# Patient Record
Sex: Female | Born: 1945
Health system: Southern US, Community
[De-identification: ages and names within clinical notes are randomized; demographics above are authoritative.]

## PROBLEM LIST (undated history)

## (undated) DIAGNOSIS — I10 Essential (primary) hypertension: Secondary | ICD-10-CM

## (undated) DIAGNOSIS — K219 Gastro-esophageal reflux disease without esophagitis: Secondary | ICD-10-CM

## (undated) HISTORY — DX: Essential (primary) hypertension: I10

## (undated) HISTORY — PX: TONSILLECTOMY: SUR1361

## (undated) HISTORY — PX: TUBAL LIGATION: SHX77

## (undated) HISTORY — DX: Gastro-esophageal reflux disease without esophagitis: K21.9

## (undated) HISTORY — PX: SKIN BIOPSY: SHX1

---

## 2001-11-14 ENCOUNTER — Encounter: Payer: Self-pay | Admitting: Emergency Medicine

## 2001-11-14 ENCOUNTER — Emergency Department (HOSPITAL_COMMUNITY): Admission: EM | Admit: 2001-11-14 | Discharge: 2001-11-14 | Payer: Self-pay | Admitting: Emergency Medicine

## 2002-01-08 ENCOUNTER — Ambulatory Visit (HOSPITAL_COMMUNITY): Admission: RE | Admit: 2002-01-08 | Discharge: 2002-01-08 | Payer: Self-pay | Admitting: Internal Medicine

## 2002-01-08 ENCOUNTER — Encounter: Payer: Self-pay | Admitting: Internal Medicine

## 2002-01-15 ENCOUNTER — Encounter: Payer: Self-pay | Admitting: Internal Medicine

## 2002-01-15 ENCOUNTER — Ambulatory Visit (HOSPITAL_COMMUNITY): Admission: RE | Admit: 2002-01-15 | Discharge: 2002-01-15 | Payer: Self-pay | Admitting: Internal Medicine

## 2002-06-26 ENCOUNTER — Ambulatory Visit (HOSPITAL_COMMUNITY): Admission: RE | Admit: 2002-06-26 | Discharge: 2002-06-26 | Payer: Self-pay | Admitting: Internal Medicine

## 2002-06-26 ENCOUNTER — Encounter: Payer: Self-pay | Admitting: Internal Medicine

## 2007-04-15 ENCOUNTER — Emergency Department (HOSPITAL_COMMUNITY): Admission: EM | Admit: 2007-04-15 | Discharge: 2007-04-15 | Payer: Self-pay | Admitting: Emergency Medicine

## 2010-01-09 ENCOUNTER — Ambulatory Visit (HOSPITAL_COMMUNITY): Admission: RE | Admit: 2010-01-09 | Discharge: 2010-01-09 | Payer: Self-pay | Admitting: Internal Medicine

## 2010-02-10 ENCOUNTER — Encounter: Payer: Self-pay | Admitting: Gastroenterology

## 2010-02-27 ENCOUNTER — Ambulatory Visit (HOSPITAL_COMMUNITY): Admission: RE | Admit: 2010-02-27 | Discharge: 2010-02-27 | Payer: Self-pay | Admitting: Gastroenterology

## 2010-02-27 ENCOUNTER — Ambulatory Visit: Payer: Self-pay | Admitting: Gastroenterology

## 2010-06-25 ENCOUNTER — Encounter: Payer: Self-pay | Admitting: Internal Medicine

## 2010-07-04 NOTE — Letter (Signed)
Summary: TRIAGE ORDER  TRIAGE ORDER   Imported By: Ave Filter 02/10/2010 08:47:51  _____________________________________________________________________  External Attachment:    Type:   Image     Comment:   External Document

## 2011-03-13 LAB — URINALYSIS, ROUTINE W REFLEX MICROSCOPIC
Bilirubin Urine: NEGATIVE
Ketones, ur: NEGATIVE
Nitrite: NEGATIVE
Protein, ur: NEGATIVE
Specific Gravity, Urine: 1.015
Urobilinogen, UA: 0.2
pH: 7

## 2011-03-13 LAB — BASIC METABOLIC PANEL
BUN: 14
Calcium: 9.4
GFR calc non Af Amer: 59 — ABNORMAL LOW
Potassium: 3.7
Sodium: 139

## 2011-05-08 ENCOUNTER — Other Ambulatory Visit (HOSPITAL_COMMUNITY): Payer: Self-pay | Admitting: Internal Medicine

## 2011-05-08 DIAGNOSIS — Z139 Encounter for screening, unspecified: Secondary | ICD-10-CM

## 2011-05-17 ENCOUNTER — Ambulatory Visit (HOSPITAL_COMMUNITY)
Admission: RE | Admit: 2011-05-17 | Discharge: 2011-05-17 | Disposition: A | Discharge status: Home / Self Care | Payer: Medicare Other | Source: Ambulatory Visit | Attending: Internal Medicine | Admitting: Internal Medicine

## 2011-05-17 DIAGNOSIS — Z1231 Encounter for screening mammogram for malignant neoplasm of breast: Secondary | ICD-10-CM | POA: Insufficient documentation

## 2011-05-17 DIAGNOSIS — Z139 Encounter for screening, unspecified: Secondary | ICD-10-CM

## 2012-09-22 ENCOUNTER — Other Ambulatory Visit (HOSPITAL_COMMUNITY): Payer: Self-pay | Admitting: Internal Medicine

## 2012-09-22 DIAGNOSIS — Z0000 Encounter for general adult medical examination without abnormal findings: Secondary | ICD-10-CM

## 2012-09-25 ENCOUNTER — Ambulatory Visit (HOSPITAL_COMMUNITY): Payer: Medicare Other

## 2012-09-25 ENCOUNTER — Ambulatory Visit (HOSPITAL_COMMUNITY)
Admission: RE | Admit: 2012-09-25 | Discharge: 2012-09-25 | Disposition: A | Discharge status: Home / Self Care | Payer: Medicare Other | Source: Ambulatory Visit | Attending: Internal Medicine | Admitting: Internal Medicine

## 2012-09-25 DIAGNOSIS — Z780 Asymptomatic menopausal state: Secondary | ICD-10-CM | POA: Insufficient documentation

## 2012-09-25 DIAGNOSIS — Z0000 Encounter for general adult medical examination without abnormal findings: Secondary | ICD-10-CM

## 2012-10-31 ENCOUNTER — Ambulatory Visit (HOSPITAL_COMMUNITY)
Admission: RE | Admit: 2012-10-31 | Discharge: 2012-10-31 | Disposition: A | Discharge status: Home / Self Care | Payer: Medicare Other | Source: Ambulatory Visit | Attending: Cardiovascular Disease | Admitting: Cardiovascular Disease

## 2012-10-31 DIAGNOSIS — R011 Cardiac murmur, unspecified: Secondary | ICD-10-CM | POA: Insufficient documentation

## 2012-10-31 DIAGNOSIS — I10 Essential (primary) hypertension: Secondary | ICD-10-CM | POA: Insufficient documentation

## 2012-10-31 NOTE — Progress Notes (Signed)
*  PRELIMINARY RESULTS* Echocardiogram 2D Echocardiogram has been performed.  Conrad Lane 10/31/2012, 3:43 PM

## 2012-12-04 ENCOUNTER — Telehealth: Payer: Self-pay | Admitting: Cardiovascular Disease

## 2012-12-04 NOTE — Telephone Encounter (Signed)
Wants to know if is inecessary that she keep her appt on Monday? Please call and let her know today!

## 2012-12-08 NOTE — Telephone Encounter (Signed)
Returned call.  No answer/voicemail not set up.  Pt has appt at 9:15am w/ Dr. Alanda Amass.

## 2012-12-09 ENCOUNTER — Encounter: Payer: Self-pay | Admitting: Cardiovascular Disease

## 2013-05-18 ENCOUNTER — Ambulatory Visit (HOSPITAL_COMMUNITY)
Admission: RE | Admit: 2013-05-18 | Discharge: 2013-05-18 | Disposition: A | Discharge status: Home / Self Care | Payer: Medicare Other | Source: Ambulatory Visit | Attending: Internal Medicine | Admitting: Internal Medicine

## 2013-05-18 DIAGNOSIS — Z1231 Encounter for screening mammogram for malignant neoplasm of breast: Secondary | ICD-10-CM | POA: Insufficient documentation

## 2013-05-18 DIAGNOSIS — Z0000 Encounter for general adult medical examination without abnormal findings: Secondary | ICD-10-CM

## 2013-05-19 ENCOUNTER — Ambulatory Visit (HOSPITAL_COMMUNITY): Payer: Medicare Other

## 2013-12-31 ENCOUNTER — Other Ambulatory Visit: Payer: Self-pay | Admitting: *Deleted

## 2013-12-31 MED ORDER — LISINOPRIL-HYDROCHLOROTHIAZIDE 10-12.5 MG PO TABS: 1.0000 | ORAL_TABLET | Freq: Every day | ORAL | Status: DC

## 2013-12-31 NOTE — Telephone Encounter (Signed)
Rx was sent to pharmacy electronically. 

## 2014-07-26 DIAGNOSIS — H524 Presbyopia: Secondary | ICD-10-CM | POA: Diagnosis not present

## 2014-07-26 DIAGNOSIS — H52223 Regular astigmatism, bilateral: Secondary | ICD-10-CM | POA: Diagnosis not present

## 2014-07-26 DIAGNOSIS — H5203 Hypermetropia, bilateral: Secondary | ICD-10-CM | POA: Diagnosis not present

## 2014-12-07 ENCOUNTER — Other Ambulatory Visit (HOSPITAL_COMMUNITY): Payer: Self-pay | Admitting: Physician Assistant

## 2014-12-07 DIAGNOSIS — Z6836 Body mass index (BMI) 36.0-36.9, adult: Secondary | ICD-10-CM | POA: Diagnosis not present

## 2014-12-07 DIAGNOSIS — E6609 Other obesity due to excess calories: Secondary | ICD-10-CM | POA: Diagnosis not present

## 2014-12-07 DIAGNOSIS — I10 Essential (primary) hypertension: Secondary | ICD-10-CM | POA: Diagnosis not present

## 2014-12-07 DIAGNOSIS — Z1231 Encounter for screening mammogram for malignant neoplasm of breast: Secondary | ICD-10-CM

## 2014-12-07 DIAGNOSIS — Z0000 Encounter for general adult medical examination without abnormal findings: Secondary | ICD-10-CM | POA: Diagnosis not present

## 2014-12-07 DIAGNOSIS — Z1389 Encounter for screening for other disorder: Secondary | ICD-10-CM | POA: Diagnosis not present

## 2014-12-09 DIAGNOSIS — M79671 Pain in right foot: Secondary | ICD-10-CM | POA: Diagnosis not present

## 2014-12-09 DIAGNOSIS — M2041 Other hammer toe(s) (acquired), right foot: Secondary | ICD-10-CM | POA: Diagnosis not present

## 2014-12-09 DIAGNOSIS — M79674 Pain in right toe(s): Secondary | ICD-10-CM | POA: Diagnosis not present

## 2014-12-16 ENCOUNTER — Ambulatory Visit (HOSPITAL_COMMUNITY): Payer: Self-pay

## 2014-12-16 ENCOUNTER — Ambulatory Visit (HOSPITAL_COMMUNITY)
Admission: RE | Admit: 2014-12-16 | Discharge: 2014-12-16 | Disposition: A | Discharge status: Home / Self Care | Payer: Medicare Other | Source: Ambulatory Visit | Attending: Physician Assistant | Admitting: Physician Assistant

## 2014-12-16 DIAGNOSIS — Z1231 Encounter for screening mammogram for malignant neoplasm of breast: Secondary | ICD-10-CM | POA: Insufficient documentation

## 2014-12-24 ENCOUNTER — Encounter: Payer: Self-pay | Admitting: *Deleted

## 2015-01-26 ENCOUNTER — Encounter: Payer: Self-pay | Admitting: Cardiovascular Disease

## 2015-04-22 DIAGNOSIS — Z23 Encounter for immunization: Secondary | ICD-10-CM | POA: Diagnosis not present

## 2015-04-22 DIAGNOSIS — Z1389 Encounter for screening for other disorder: Secondary | ICD-10-CM | POA: Diagnosis not present

## 2015-04-22 DIAGNOSIS — I10 Essential (primary) hypertension: Secondary | ICD-10-CM | POA: Diagnosis not present

## 2015-04-22 DIAGNOSIS — Z6836 Body mass index (BMI) 36.0-36.9, adult: Secondary | ICD-10-CM | POA: Diagnosis not present

## 2015-04-22 DIAGNOSIS — N3281 Overactive bladder: Secondary | ICD-10-CM | POA: Diagnosis not present

## 2015-04-22 DIAGNOSIS — E782 Mixed hyperlipidemia: Secondary | ICD-10-CM | POA: Diagnosis not present

## 2015-05-02 DIAGNOSIS — R311 Benign essential microscopic hematuria: Secondary | ICD-10-CM | POA: Diagnosis not present

## 2015-05-24 DIAGNOSIS — I10 Essential (primary) hypertension: Secondary | ICD-10-CM | POA: Diagnosis not present

## 2015-05-24 DIAGNOSIS — E6609 Other obesity due to excess calories: Secondary | ICD-10-CM | POA: Diagnosis not present

## 2015-05-24 DIAGNOSIS — Z6836 Body mass index (BMI) 36.0-36.9, adult: Secondary | ICD-10-CM | POA: Diagnosis not present

## 2015-05-24 DIAGNOSIS — E669 Obesity, unspecified: Secondary | ICD-10-CM | POA: Diagnosis not present

## 2015-05-24 DIAGNOSIS — Z1389 Encounter for screening for other disorder: Secondary | ICD-10-CM | POA: Diagnosis not present

## 2015-05-24 DIAGNOSIS — R3129 Other microscopic hematuria: Secondary | ICD-10-CM | POA: Diagnosis not present

## 2015-06-09 ENCOUNTER — Emergency Department (HOSPITAL_COMMUNITY)
Admission: EM | Admit: 2015-06-09 | Discharge: 2015-06-09 | Disposition: A | Discharge status: Home / Self Care | Payer: Medicare Other | Attending: Emergency Medicine | Admitting: Emergency Medicine

## 2015-06-09 ENCOUNTER — Encounter (HOSPITAL_COMMUNITY): Payer: Self-pay | Admitting: *Deleted

## 2015-06-09 ENCOUNTER — Emergency Department (HOSPITAL_COMMUNITY): Payer: Medicare Other

## 2015-06-09 DIAGNOSIS — I10 Essential (primary) hypertension: Secondary | ICD-10-CM | POA: Insufficient documentation

## 2015-06-09 DIAGNOSIS — R079 Chest pain, unspecified: Secondary | ICD-10-CM | POA: Diagnosis not present

## 2015-06-09 DIAGNOSIS — R030 Elevated blood-pressure reading, without diagnosis of hypertension: Secondary | ICD-10-CM

## 2015-06-09 DIAGNOSIS — R630 Anorexia: Secondary | ICD-10-CM | POA: Insufficient documentation

## 2015-06-09 DIAGNOSIS — R0789 Other chest pain: Secondary | ICD-10-CM

## 2015-06-09 DIAGNOSIS — R350 Frequency of micturition: Secondary | ICD-10-CM | POA: Diagnosis not present

## 2015-06-09 DIAGNOSIS — Z1389 Encounter for screening for other disorder: Secondary | ICD-10-CM | POA: Diagnosis not present

## 2015-06-09 DIAGNOSIS — IMO0001 Reserved for inherently not codable concepts without codable children: Secondary | ICD-10-CM

## 2015-06-09 DIAGNOSIS — I209 Angina pectoris, unspecified: Secondary | ICD-10-CM | POA: Diagnosis not present

## 2015-06-09 LAB — BASIC METABOLIC PANEL
ANION GAP: 7 (ref 5–15)
BUN: 10 mg/dL (ref 6–20)
CHLORIDE: 105 mmol/L (ref 101–111)
CO2: 29 mmol/L (ref 22–32)
Calcium: 9.4 mg/dL (ref 8.9–10.3)
Creatinine, Ser: 0.8 mg/dL (ref 0.44–1.00)
Glucose, Bld: 100 mg/dL — ABNORMAL HIGH (ref 65–99)
POTASSIUM: 4 mmol/L (ref 3.5–5.1)
SODIUM: 141 mmol/L (ref 135–145)

## 2015-06-09 LAB — URINALYSIS, ROUTINE W REFLEX MICROSCOPIC
BILIRUBIN URINE: NEGATIVE
Glucose, UA: NEGATIVE mg/dL
HGB URINE DIPSTICK: NEGATIVE
Ketones, ur: NEGATIVE mg/dL
Leukocytes, UA: NEGATIVE
NITRITE: NEGATIVE
PROTEIN: NEGATIVE mg/dL
SPECIFIC GRAVITY, URINE: 1.004 — AB (ref 1.005–1.030)
pH: 6.5 (ref 5.0–8.0)

## 2015-06-09 LAB — CBC
HEMATOCRIT: 37 % (ref 36.0–46.0)
Hemoglobin: 11.9 g/dL — ABNORMAL LOW (ref 12.0–15.0)
MCH: 27.7 pg (ref 26.0–34.0)
MCHC: 32.2 g/dL (ref 30.0–36.0)
MCV: 86 fL (ref 78.0–100.0)
PLATELETS: 203 10*3/uL (ref 150–400)
RBC: 4.3 MIL/uL (ref 3.87–5.11)
RDW: 14.5 % (ref 11.5–15.5)
WBC: 4.7 10*3/uL (ref 4.0–10.5)

## 2015-06-09 LAB — I-STAT TROPONIN, ED: Troponin i, poc: 0 ng/mL (ref 0.00–0.08)

## 2015-06-09 LAB — TROPONIN I

## 2015-06-09 MED ORDER — LISINOPRIL 10 MG PO TABS: 20.0000 mg | ORAL_TABLET | Freq: Every day | ORAL | Status: DC

## 2015-06-09 NOTE — Discharge Instructions (Signed)
Hypertension °Hypertension, commonly called high blood pressure, is when the force of blood pumping through your arteries is too strong. Your arteries are the blood vessels that carry blood from your heart throughout your body. A blood pressure reading consists of a higher number over a lower number, such as 110/72. The higher number (systolic) is the pressure inside your arteries when your heart pumps. The lower number (diastolic) is the pressure inside your arteries when your heart relaxes. Ideally you want your blood pressure below 120/80. °Hypertension forces your heart to work harder to pump blood. Your arteries may become narrow or stiff. Having untreated or uncontrolled hypertension can cause heart attack, stroke, kidney disease, and other problems. °RISK FACTORS °Some risk factors for high blood pressure are controllable. Others are not.  °Risk factors you cannot control include:  °· Race. You may be at higher risk if you are African American. °· Age. Risk increases with age. °· Gender. Men are at higher risk than women before age 45 years. After age 65, women are at higher risk than men. °Risk factors you can control include: °· Not getting enough exercise or physical activity. °· Being overweight. °· Getting too much fat, sugar, calories, or salt in your diet. °· Drinking too much alcohol. °SIGNS AND SYMPTOMS °Hypertension does not usually cause signs or symptoms. Extremely high blood pressure (hypertensive crisis) may cause headache, anxiety, shortness of breath, and nosebleed. °DIAGNOSIS °To check if you have hypertension, your health care provider will measure your blood pressure while you are seated, with your arm held at the level of your heart. It should be measured at least twice using the same arm. Certain conditions can cause a difference in blood pressure between your right and left arms. A blood pressure reading that is higher than normal on one occasion does not mean that you need treatment. If  it is not clear whether you have high blood pressure, you may be asked to return on a different day to have your blood pressure checked again. Or, you may be asked to monitor your blood pressure at home for 1 or more weeks. °TREATMENT °Treating high blood pressure includes making lifestyle changes and possibly taking medicine. Living a healthy lifestyle can help lower high blood pressure. You may need to change some of your habits. °Lifestyle changes may include: °· Following the DASH diet. This diet is high in fruits, vegetables, and whole grains. It is low in salt, red meat, and added sugars. °· Keep your sodium intake below 2,300 mg per day. °· Getting at least 30-45 minutes of aerobic exercise at least 4 times per week. °· Losing weight if necessary. °· Not smoking. °· Limiting alcoholic beverages. °· Learning ways to reduce stress. °Your health care provider may prescribe medicine if lifestyle changes are not enough to get your blood pressure under control, and if one of the following is true: °· You are 18-59 years of age and your systolic blood pressure is above 140. °· You are 60 years of age or older, and your systolic blood pressure is above 150. °· Your diastolic blood pressure is above 90. °· You have diabetes, and your systolic blood pressure is over 140 or your diastolic blood pressure is over 90. °· You have kidney disease and your blood pressure is above 140/90. °· You have heart disease and your blood pressure is above 140/90. °Your personal target blood pressure may vary depending on your medical conditions, your age, and other factors. °HOME CARE INSTRUCTIONS °·   Have your blood pressure rechecked as directed by your health care provider.   °· Take medicines only as directed by your health care provider. Follow the directions carefully. Blood pressure medicines must be taken as prescribed. The medicine does not work as well when you skip doses. Skipping doses also puts you at risk for  problems. °· Do not smoke.   °· Monitor your blood pressure at home as directed by your health care provider.  °SEEK MEDICAL CARE IF:  °· You think you are having a reaction to medicines taken. °· You have recurrent headaches or feel dizzy. °· You have swelling in your ankles. °· You have trouble with your vision. °SEEK IMMEDIATE MEDICAL CARE IF: °· You develop a severe headache or confusion. °· You have unusual weakness, numbness, or feel faint. °· You have severe chest or abdominal pain. °· You vomit repeatedly. °· You have trouble breathing. °MAKE SURE YOU:  °· Understand these instructions. °· Will watch your condition. °· Will get help right away if you are not doing well or get worse. °  °This information is not intended to replace advice given to you by your health care provider. Make sure you discuss any questions you have with your health care provider. °  °Document Released: 05/21/2005 Document Revised: 10/05/2014 Document Reviewed: 03/13/2013 °Elsevier Interactive Patient Education ©2016 [employer] ° °Nonspecific Chest Pain  °Chest pain can be caused by many different conditions. There is always a chance that your pain could be related to something serious, such as a heart attack or a blood clot in your lungs. Chest pain can also be caused by conditions that are not life-threatening. If you have chest pain, it is very important to follow up with your health care provider. °CAUSES  °Chest pain can be caused by: °· Heartburn. °· Pneumonia or bronchitis. °· Anxiety or stress. °· Inflammation around your heart (pericarditis) or lung (pleuritis or pleurisy). °· A blood clot in your lung. °· A collapsed lung (pneumothorax). It can develop suddenly on its own (spontaneous pneumothorax) or from trauma to the chest. °· Shingles infection (varicella-zoster virus). °· Heart attack. °· Damage to the bones, muscles, and cartilage that make up your chest wall. This can include: °¨ Bruised bones due to  injury. °¨ Strained muscles or cartilage due to frequent or repeated coughing or overwork. °¨ Fracture to one or more ribs. °¨ Sore cartilage due to inflammation (costochondritis). °RISK FACTORS  °Risk factors for chest pain may include: °· Activities that increase your risk for trauma or injury to your chest. °· Respiratory infections or conditions that cause frequent coughing. °· Medical conditions or overeating that can cause heartburn. °· Heart disease or family history of heart disease. °· Conditions or health behaviors that increase your risk of developing a blood clot. °· Having had chicken pox (varicella zoster). °SIGNS AND SYMPTOMS °Chest pain can feel like: °· Burning or tingling on the surface of your chest or deep in your chest. °· Crushing, pressure, aching, or squeezing pain. °· Dull or sharp pain that is worse when you move, cough, or take a deep breath. °· Pain that is also felt in your back, neck, shoulder, or arm, or pain that spreads to any of these areas. °Your chest pain may come and go, or it may stay constant. °DIAGNOSIS °Lab tests or other studies may be needed to find the cause of your pain. Your health care provider may have you take a test called an ambulatory ECG (electrocardiogram). An ECG records   your heartbeat patterns at the time the test is performed. You may also have other tests, such as: °· Transthoracic echocardiogram (TTE). During echocardiography, sound waves are used to create a picture of all of the heart structures and to look at how blood flows through your heart. °· Transesophageal echocardiogram (TEE). This is a more advanced imaging test that obtains images from inside your body. It allows your health care provider to see your heart in finer detail. °· Cardiac monitoring. This allows your health care provider to monitor your heart rate and rhythm in real time. °· Holter monitor. This is a portable device that records your heartbeat and can help to diagnose abnormal  heartbeats. It allows your health care provider to track your heart activity for several days, if needed. °· Stress tests. These can be done through exercise or by taking medicine that makes your heart beat more quickly. °· Blood tests. °· Imaging tests. °TREATMENT  °Your treatment depends on what is causing your chest pain. Treatment may include: °· Medicines. These may include: °¨ Acid blockers for heartburn. °¨ Anti-inflammatory medicine. °¨ Pain medicine for inflammatory conditions. °¨ Antibiotic medicine, if an infection is present. °¨ Medicines to dissolve blood clots. °¨ Medicines to treat coronary artery disease. °· Supportive care for conditions that do not require medicines. This may include: °¨ Resting. °¨ Applying heat or cold packs to injured areas. °¨ Limiting activities until pain decreases. °HOME CARE INSTRUCTIONS °· If you were prescribed an antibiotic medicine, finish it all even if you start to feel better. °· Avoid any activities that bring on chest pain. °· Do not use any tobacco products, including cigarettes, chewing tobacco, or electronic cigarettes. If you need help quitting, ask your health care provider. °· Do not drink alcohol. °· Take medicines only as directed by your health care provider. °· Keep all follow-up visits as directed by your health care provider. This is important. This includes any further testing if your chest pain does not go away. °· If heartburn is the cause for your chest pain, you may be told to keep your head raised (elevated) while sleeping. This reduces the chance that acid will go from your stomach into your esophagus. °· Make lifestyle changes as directed by your health care provider. These may include: °¨ Getting regular exercise. Ask your health care provider to suggest some activities that are safe for you. °¨ Eating a heart-healthy diet. A registered dietitian can help you to learn healthy eating options. °¨ Maintaining a healthy weight. °¨ Managing  diabetes, if necessary. °¨ Reducing stress. °SEEK MEDICAL CARE IF: °· Your chest pain does not go away after treatment. °· You have a rash with blisters on your chest. °· You have a fever. °SEEK IMMEDIATE MEDICAL CARE IF:  °· Your chest pain is worse. °· You have an increasing cough, or you cough up blood. °· You have severe abdominal pain. °· You have severe weakness. °· You faint. °· You have chills. °· You have sudden, unexplained chest discomfort. °· You have sudden, unexplained discomfort in your arms, back, neck, or jaw. °· You have shortness of breath at any time. °· You suddenly start to sweat, or your skin gets clammy. °· You feel nauseous or you vomit. °· You suddenly feel light-headed or dizzy. °· Your heart begins to beat quickly, or it feels like it is skipping beats. °These symptoms may represent a serious problem that is an emergency. Do not wait to see if the symptoms will go   away. Get medical help right away. Call your local emergency services (911 in the [place] Do not drive yourself to the hospital. °  °This information is not intended to replace advice given to you by your health care provider. Make sure you discuss any questions you have with your health care provider. °  °Document Released: 02/28/2005 Document Revised: 06/11/2014 Document Reviewed: 12/25/2013 °Elsevier Interactive Patient Education ©2016 [employer] Inc. ° °

## 2015-06-09 NOTE — ED Notes (Signed)
Pt states that her BP meds have been changed twice in the past 2 months. States that her BP  Has been fluctuating, last BP at home was 187/110. Also c/o "chest misery." denies chest pain but says its feels miserable. States that she is also having urinary frequency.

## 2015-06-09 NOTE — ED Notes (Signed)
The pt checked in and went to restroom

## 2015-06-09 NOTE — ED Provider Notes (Signed)
CSN: [id number]     Arrival date & time 06/09/15  1725 History   First MD Initiated Contact with Patient 06/09/15 2051     Chief Complaint  Patient presents with  . Hypertension  . Urinary Frequency     (Consider location/radiation/quality/duration/timing/severity/associated sxs/prior Treatment) Patient is a 70 y.o. female presenting with hypertension. The history is provided by the patient.  Hypertension This is a recurrent problem. The current episode started more than 1 year ago. The problem occurs intermittently. The problem has been waxing and waning. Associated symptoms include anorexia, chest pain and urinary symptoms. Pertinent negatives include no abdominal pain, arthralgias, change in bowel habit, chills, congestion, coughing, diaphoresis, fatigue, fever, headaches, joint swelling, myalgias, nausea, neck pain, numbness, rash, sore throat, swollen glands, vertigo, visual change, vomiting or weakness. Nothing aggravates the symptoms. She has tried nothing for the symptoms.    Past Medical History  Diagnosis Date  . Hypertension    History reviewed. No pertinent past surgical history. Family History  Problem Relation Age of Onset  . Hypertension Mother   . Cancer Father    Social History  Substance Use Topics  . Smoking status: Never Smoker   . Smokeless tobacco: None  . Alcohol Use: 0.0 oz/week    0 Standard drinks or equivalent per week   OB History    No data available     Review of Systems  Constitutional: Negative for fever, chills, diaphoresis and fatigue.  HENT: Negative for congestion and sore throat.   Eyes: Negative for pain.  Respiratory: Positive for chest tightness. Negative for cough.   Cardiovascular: Positive for chest pain.  Gastrointestinal: Positive for anorexia. Negative for nausea, vomiting, abdominal pain and change in bowel habit.  Genitourinary: Negative for dysuria.  Musculoskeletal: Negative for myalgias, joint swelling, arthralgias and  neck pain.  Skin: Negative for rash.  Neurological: Negative for vertigo, weakness, numbness and headaches.  Psychiatric/Behavioral: Negative for confusion.      Allergies  Review of patient's allergies indicates no known allergies.  Home Medications   Prior to Admission medications   Medication Sig Start Date End Date Taking? Authorizing Provider  lisinopril (ZESTRIL) 10 MG tablet Take 2 tablets (20 mg total) by mouth daily. 06/09/15   Stacy GardnerAndrew Eidan Muellner, MD   BP 138/88 mmHg  Pulse 66  Temp(Src) 98.2 F (36.8 C) (Oral)  Resp 16  SpO2 97% Physical Exam  Constitutional: She is oriented to person, place, and time. She appears well-developed and well-nourished. No distress.  HENT:  Head: Normocephalic and atraumatic. Head is without abrasion and without contusion.  Eyes: Conjunctivae and EOM are normal. Pupils are equal, round, and reactive to light.  Neck: Normal range of motion.  Cardiovascular: Normal rate and regular rhythm.  Exam reveals no gallop, no friction rub and no decreased pulses.   No murmur heard. Pulmonary/Chest: Effort normal and breath sounds normal. No respiratory distress. She has no wheezes. She has no rales. She exhibits no tenderness.  Abdominal: Soft. Bowel sounds are normal. She exhibits no distension and no mass. There is no tenderness. There is no rebound and no guarding.  Neurological: She is alert and oriented to person, place, and time. No cranial nerve deficit.  Skin: Skin is warm and dry. No rash noted. She is not diaphoretic. No erythema.  Psychiatric: She has a normal mood and affect. Her behavior is normal. Thought content normal.    ED Course  Procedures (including critical care time) Labs Review Labs Reviewed  BASIC METABOLIC  PANEL - Abnormal; Notable for the following:    Glucose, Bld 100 (*)    All other components within normal limits  CBC - Abnormal; Notable for the following:    Hemoglobin 11.9 (*)    All other components within normal  limits  URINALYSIS, ROUTINE W REFLEX MICROSCOPIC (NOT AT [facility]) - Abnormal; Notable for the following:    Specific Gravity, Urine 1.004 (*)    All other components within normal limits  TROPONIN I  Rosezena Sensor, ED    Imaging Review Dg Chest 2 View  06/09/2015  CLINICAL DATA:  Chest pain. Chest discomfort and hypertension for 2 months. EXAM: CHEST  2 VIEW COMPARISON:  None. FINDINGS: Heart at the upper limits normal in size, there is tortuosity of the thoracic aorta. Pulmonary vasculature is normal. No consolidation, pleural effusion, or pneumothorax. No acute osseous abnormalities are seen. IMPRESSION: No acute pulmonary process. Electronically Signed   By: Rubye Oaks M.D.   On: 06/09/2015 18:20   I have personally reviewed and evaluated these images and lab results as part of my medical decision-making.   EKG Interpretation   Date/Time:  Thursday June 09 2015 17:36:13 EST Ventricular Rate:  73 PR Interval:  206 QRS Duration: 86 QT Interval:  384 QTC Calculation: 423 R Axis:   70 Text Interpretation:  Normal sinus rhythm Cannot rule out Anterior infarct  , age undetermined Abnormal ECG When compared with ECG of 04/15/2007, No  significant change was found Confirmed by [facility]  MD, DAVID ([id number]) on  06/09/2015 9:51:35 PM      MDM   Final diagnoses:  Chest discomfort  Elevated BP    70 year old black female with past medical history of hypertension presents in the setting of elevated blood pressure, chest discomfort and urinary frequency. Patient reports over the last 2 months that her PCP has been changing around her blood pressure regimen. She reports that she intermittently has feelings of "chest misery". She reports during these times she checks her blood pressure and is normally elevated in the 160s to 180s systolic. She reports is an worsening over the last several days. When she has these symptoms she sometimes says she has mild chest pressure. She reports she had  some of this this morning but it has resolved at this time. Patient also reports urinary frequency. She reports she was on medications for this in the past but quit taking it due to dry mouth. Patient denies headache, vision change, shortness of breath, abdominal pain, nausea, vomiting, constipation, diarrhea, fevers, rashes.  Patient resting comfortably on physical examination. Patient had laboratory analysis obtained in triage which revealed no detectable troponin, no significant electrolyte abnormalities. No elevation in creatinine. No elevation in white blood cell count or anemia. Urinalysis without signs of urinary tract infection. EKG was obtained which was not significantly changed from previous EKG in no signs of acute ischemia.  Patient without significant elevation in blood pressure and emergency department does not require emergent treatment at this time. Repeat troponin without significant elevation. Patient did not have any chest discomfort while in emergency department. At this time believe patient is safe for discharge home with follow-up with cardiology for further management of chest discomfort. Additionally increased patient's lisinopril from 10 mg to 20 mg daily. Advised patient to follow up as soon as possible with PCP for further management of blood pressure. Patient given strict return precautions and stable at time of discharge. Patient and family in agreement with plan.  Attending has seen  and evaluated patient and Dr. Preston Fleeting is in agreement with plan.    Stacy Gardner, MD 06/09/15 2352  Dione Booze, MD 06/10/15 0001

## 2015-06-16 DIAGNOSIS — Z1389 Encounter for screening for other disorder: Secondary | ICD-10-CM | POA: Diagnosis not present

## 2015-06-16 DIAGNOSIS — I10 Essential (primary) hypertension: Secondary | ICD-10-CM | POA: Diagnosis not present

## 2015-06-17 ENCOUNTER — Ambulatory Visit (INDEPENDENT_AMBULATORY_CARE_PROVIDER_SITE_OTHER): Payer: Medicare Other | Admitting: Cardiology

## 2015-06-17 ENCOUNTER — Encounter: Payer: Self-pay | Admitting: Cardiology

## 2015-06-17 VITALS — BP 144/98 | HR 81 | Ht 65.0 in | Wt 213.0 lb

## 2015-06-17 DIAGNOSIS — R0789 Other chest pain: Secondary | ICD-10-CM

## 2015-06-17 DIAGNOSIS — I10 Essential (primary) hypertension: Secondary | ICD-10-CM

## 2015-06-17 NOTE — Progress Notes (Signed)
Patient ID: Antonietta JewelHazel L Robley, female   DOB: 1946/01/18, 70 y.o.   MRN: [id number]     Clinical Summary Ms. Lady GaryCannon is a 70 y.o.female seen today as a new patient, she is referred from recent ER visit by Dr Dione Boozeavid Glick  1. Chest pain - seen in ER 06/09/15. Feeling chest fullness and into stomach. Felt warm all over, some belching. No SOB.  Trop neg x 2, EKG no ischemic changes, CXR   2. HTN - elevated by recent home numbers and with pcp. Systolics have been running 170s  - lisinopril increased from 10 to 20mg  daily at recent ER visit, pcp recently started norvasc.    Past Medical History  Diagnosis Date  . Hypertension      No Known Allergies   Current Outpatient Prescriptions  Medication Sig Dispense Refill  . lisinopril (ZESTRIL) 10 MG tablet Take 2 tablets (20 mg total) by mouth daily. 60 tablet 0   No current facility-administered medications for this visit.     No Known Allergies    Family History  Problem Relation Age of Onset  . Hypertension Mother   . Cancer Father      Social History Ms. Lady GaryCannon reports that she has never smoked. She does not have any smokeless tobacco history on file. Ms. Lady GaryCannon reports that she drinks alcohol.   Review of Systems CONSTITUTIONAL: No weight loss, fever, chills, weakness or fatigue.  HEENT: Eyes: No visual loss, blurred vision, double vision or yellow sclerae.No hearing loss, sneezing, congestion, runny nose or sore throat.  SKIN: No rash or itching.  CARDIOVASCULAR: per hpi RESPIRATORY: No shortness of breath, cough or sputum.  GASTROINTESTINAL: No anorexia, nausea, vomiting or diarrhea. No abdominal pain or blood.  GENITOURINARY: No burning on urination, no polyuria NEUROLOGICAL: No headache, dizziness, syncope, paralysis, ataxia, numbness or tingling in the extremities. No change in bowel or bladder control.  MUSCULOSKELETAL: No muscle, back pain, joint pain or stiffness.  LYMPHATICS: No enlarged nodes. No history of  splenectomy.  PSYCHIATRIC: No history of depression or anxiety.  ENDOCRINOLOGIC: No reports of sweating, cold or heat intolerance. No polyuria or polydipsia.  [name].   Physical Examination Filed Vitals:   06/17/15 1423  BP: 144/98  Pulse: 81   Filed Vitals:   06/17/15 1423  Height: 5\' 5"  (1.651 m)  Weight: 213 lb (96.616 kg)    Gen: resting comfortably, no acute distress HEENT: no scleral icterus, pupils equal round and reactive, no palptable cervical adenopathy,  CV: RRR, 2/6 systolic murmur RUSB, no jvd Resp: Clear to auscultation bilaterally GI: abdomen is soft, non-tender, non-distended, normal bowel sounds, no hepatosplenomegaly MSK: extremities are warm, no edema.  Skin: warm, no rash Neuro:  no focal deficits Psych: appropriate affect   Diagnostic Studies 10/2012 echo Study Conclusions  - Left ventricle: The cavity size was normal. Wall thickness was increased in a pattern of mild LVH. There was mild concentric hypertrophy. Systolic function was normal. The estimated ejection fraction was in the range of 55% to 60%. Wall motion was normal; there were no regional wall motion abnormalities. Doppler parameters are consistent with abnormal left ventricular relaxation (grade 1 diastolic dysfunction). - Aortic valve: Valve area: 2.13cm^2(VTI). Valve area: 2.4cm^2 (Vmax). - Mitral valve: Mild regurgitation. - Atrial septum: No defect or patent foramen ovale was identified.    Assessment and Plan  1. Chest pain - unclear etiology, will obtain Lexiscan MPI. She is not comfortable on treadmill due to chronic balance issues  2. HTN -  multiple recent changes in her meds,she will submit a bp log in 1 week. Further titration based on results.       Antoine Poche, M.D.

## 2015-06-17 NOTE — Patient Instructions (Signed)
Your physician recommends that you schedule a follow-up appointment in:  1 month   Your physician has requested that you have a lexiscan myoview. For further information please visit [email or web address]. Please follow instruction sheet, as given.  HOLD NORVASC THE AM OF TEST    Take all other medications as directed      If you need a refill on your cardiac medications before your next appointment, please call your pharmacy.       Thank you for choosing [facility] !

## 2015-06-24 ENCOUNTER — Encounter (HOSPITAL_COMMUNITY)
Admission: RE | Admit: 2015-06-24 | Discharge: 2015-06-24 | Disposition: A | Discharge status: Home / Self Care | Payer: Medicare Other | Source: Ambulatory Visit | Attending: Cardiology | Admitting: Cardiology

## 2015-06-24 ENCOUNTER — Encounter (HOSPITAL_COMMUNITY): Payer: Self-pay

## 2015-06-24 ENCOUNTER — Inpatient Hospital Stay (HOSPITAL_COMMUNITY): Admission: RE | Admit: 2015-06-24 | Payer: Medicare Other | Source: Ambulatory Visit

## 2015-06-24 DIAGNOSIS — R0789 Other chest pain: Secondary | ICD-10-CM

## 2015-06-24 LAB — NM MYOCAR MULTI W/SPECT W/WALL MOTION / EF
CHL CUP NUCLEAR SDS: 3
CHL CUP NUCLEAR SRS: 1
CHL CUP RESTING HR STRESS: 67 {beats}/min
LV dias vol: 81 mL
LV sys vol: 33 mL
NUC STRESS TID: 0.86
Peak HR: 110 {beats}/min
RATE: 0.29
SSS: 4

## 2015-06-24 MED ORDER — REGADENOSON 0.4 MG/5ML IV SOLN
INTRAVENOUS | Status: AC
  Administered 2015-06-24: 0.4 mg via INTRAVENOUS
  Filled 2015-06-24: qty 5

## 2015-06-24 MED ORDER — SODIUM CHLORIDE 0.9 % IJ SOLN
INTRAMUSCULAR | Status: AC
  Administered 2015-06-24: 10 mL via INTRAVENOUS
  Filled 2015-06-24: qty 3

## 2015-06-24 MED ORDER — TECHNETIUM TC 99M SESTAMIBI - CARDIOLITE
10.0000 | Freq: Once | INTRAVENOUS | Status: AC | PRN
  Administered 2015-06-24: 10 via INTRAVENOUS

## 2015-06-24 MED ORDER — TECHNETIUM TC 99M SESTAMIBI GENERIC - CARDIOLITE
30.0000 | Freq: Once | INTRAVENOUS | Status: AC | PRN
  Administered 2015-06-24: 29.2 via INTRAVENOUS

## 2015-07-15 ENCOUNTER — Encounter: Payer: Self-pay | Admitting: Adult Health

## 2015-07-15 ENCOUNTER — Ambulatory Visit (INDEPENDENT_AMBULATORY_CARE_PROVIDER_SITE_OTHER): Payer: Medicare Other | Admitting: Adult Health

## 2015-07-15 VITALS — BP 106/62 | HR 76 | Ht 65.0 in | Wt 214.0 lb

## 2015-07-15 DIAGNOSIS — I10 Essential (primary) hypertension: Secondary | ICD-10-CM | POA: Diagnosis not present

## 2015-07-15 NOTE — Patient Instructions (Signed)
Your physician recommends that you schedule a follow-up appointment As Needed  Your physician has recommended you make the following change in your medication:   STOP TAKING: Amlodipinie (Norvasc)   Thank you for choosing [facility]!

## 2015-07-15 NOTE — Progress Notes (Deleted)
Name: Mariah Ross    DOB: [birth year]  Age: 70 y.o.  MR#: [id number]       PCP:  Glo Herring., MD      Insurance: Payor: [employer] MEDICARE / Plan: [facility] MEDICARE / Product Type: *No Product type* /   CC:   No chief complaint on file.   VS Filed Vitals:   07/15/15 1526  BP: 106/62  Pulse: 76  Height: 5' 5"  (1.651 m)  Weight: 214 lb (97.07 kg)  SpO2: 99%    Weights Current Weight  07/15/15 214 lb (97.07 kg)  06/17/15 213 lb (96.616 kg)    Blood Pressure  BP Readings from Last 3 Encounters:  07/15/15 106/62  06/17/15 144/98  06/09/15 138/88     Admit date:  (Not on file) Last encounter with RMR:  Visit date not found   Allergy Review of patient's allergies indicates no known allergies.  Current Outpatient Prescriptions  Medication Sig Dispense Refill  . amLODipine (NORVASC) 5 MG tablet Take 5 mg by mouth daily.    [name] lisinopril-hydrochlorothiazide (PRINZIDE,ZESTORETIC) 20-12.5 MG tablet Take 1 tablet by mouth daily.     No current facility-administered medications for this visit.    Discontinued Meds:   There are no discontinued medications.  There are no active problems to display for this patient.   LABS    Component Value Date/Time   NA 141 06/09/2015 1748   NA 139 04/15/2007 2220   K 4.0 06/09/2015 1748   K 3.7 04/15/2007 2220   CL 105 06/09/2015 1748   CL 102 04/15/2007 2220   CO2 29 06/09/2015 1748   CO2 32 04/15/2007 2220   GLUCOSE 100* 06/09/2015 1748   GLUCOSE 134* 04/15/2007 2220   BUN 10 06/09/2015 1748   BUN 14 04/15/2007 2220   CREATININE 0.80 06/09/2015 1748   CREATININE 0.95 04/15/2007 2220   CALCIUM 9.4 06/09/2015 1748   CALCIUM 9.4 04/15/2007 2220   GFRNONAA >60 06/09/2015 1748   GFRNONAA 59* 04/15/2007 2220   GFRAA >60 06/09/2015 1748   GFRAA  04/15/2007 2220    >60        The eGFR has been calculated using the MDRD equation. This calculation has not been validated in all clinical   CMP     Component Value  Date/Time   NA 141 06/09/2015 1748   K 4.0 06/09/2015 1748   CL 105 06/09/2015 1748   CO2 29 06/09/2015 1748   GLUCOSE 100* 06/09/2015 1748   BUN 10 06/09/2015 1748   CREATININE 0.80 06/09/2015 1748   CALCIUM 9.4 06/09/2015 1748   GFRNONAA >60 06/09/2015 1748   GFRAA >60 06/09/2015 1748       Component Value Date/Time   WBC 4.7 06/09/2015 1748   HGB 11.9* 06/09/2015 1748   HCT 37.0 06/09/2015 1748   MCV 86.0 06/09/2015 1748    Lipid Panel  No results found for: CHOL, TRIG, HDL, CHOLHDL, VLDL, LDLCALC, LDLDIRECT  ABG No results found for: PHART, PCO2ART, PO2ART, HCO3, TCO2, ACIDBASEDEF, O2SAT   No results found for: TSH BNP (last 3 results) No results for input(s): BNP in the last 8760 hours.  ProBNP (last 3 results) No results for input(s): PROBNP in the last 8760 hours.  Cardiac Panel (last 3 results) No results for input(s): CKTOTAL, CKMB, TROPONINI, RELINDX in the last 72 hours.  Iron/TIBC/Ferritin/ %Sat No results found for: IRON, TIBC, FERRITIN, IRONPCTSAT   EKG Orders placed or performed during the hospital encounter of 06/09/15  .  EKG 12-Lead  . EKG 12-Lead  . ED EKG within 10 minutes  . ED EKG within 10 minutes  . EKG     Prior Assessment and Plan    Imaging: Nm Myocar Multi W/spect W/wall Motion / Ef  06/24/2015   There was no ST segment deviation noted during stress.  The study is normal.  This is a low risk study.  The left ventricular ejection fraction is normal (55-65%).

## 2015-07-15 NOTE — Progress Notes (Signed)
Cardiology Office Note   Date:  07/15/2015   ID:  Mariah, Ross 03/14/1946, MRN [id number]  PCP:  Cassell Smiles., MD  Cardiologist:  Arlington Calix, NP   No chief complaint on file.     History of Present Illness: Mariah Ross is a 70 y.o. female who presents for ongoing assessment and management of chest pain, which is found to be noncardiac in etiology, hypertension.she was scheduled for LexiScan MPI, and advised to take blood pressure daily and record.  LexiScan Myoview dated 06/24/2015 demonstrated no ST segment deviation, normal study, low risk study, LVEF of 55-65%.  She comes today feeling much better.  Blood pressure is significantly improved.  She has a laminated caffeine from her diet.  She also states that she had her church pray for her period and her symptoms of chest pressure and blood pressure elevation improved.  So much so, that when she went to follow up with her primary care physician.  He cut her doses in half.  She brings with her today a list of her blood pressures, which are ranging from 98/63, to 115/72.  Past Medical History  Diagnosis Date  . Hypertension     No past surgical history on file.   Current Outpatient Prescriptions  Medication Sig Dispense Refill  . lisinopril-hydrochlorothiazide (PRINZIDE,ZESTORETIC) 20-12.5 MG tablet Take 1 tablet by mouth daily.     No current facility-administered medications for this visit.    Allergies:   Review of patient's allergies indicates no known allergies.    Social History:  The patient  reports that she has never smoked. She does not have any smokeless tobacco history on file. She reports that she drinks alcohol. She reports that she does not use illicit drugs.   Family History:  The patient's family history includes Cancer in her father; Hypertension in her mother.    ROS: All other systems are reviewed and negative. Unless otherwise mentioned in H&P    PHYSICAL EXAM: VS:  BP  106/62 mmHg  Pulse 76  Ht  (1.651 m)  Wt 214 lb (97.07 kg)  BMI 35.61 kg/m2  SpO2 99% , BMI Body mass index is 35.61 kg/(m^2). GEN: Well nourished, well developed, in no acute distress HEENT: normal Neck: no JVD, carotid bruits, or masses Cardiac: RRR; no murmurs, rubs, or gallops,no edema  Respiratory:  clear to auscultation bilaterally, normal work of breathing GI: soft, nontender, nondistended, + BS MS: no deformity or atrophy Skin: warm and dry, no rash Neuro:  Strength and sensation are intact Psych: euthymic mood, full affect   Recent Labs: 06/09/2015: BUN 10; Creatinine, Ser 0.80; Hemoglobin 11.9*; Platelets 203; Potassium 4.0; Sodium 141    Lipid Panel No results found for: CHOL, TRIG, HDL, CHOLHDL, VLDL, LDLCALC, LDLDIRECT    Wt Readings from Last 3 Encounters:  07/15/15 214 lb (97.07 kg)  06/17/15 213 lb (96.616 kg)     ASSESSMENT AND PLAN:  1. Hypertension: I believe that her blood pressure is still a little on the low side, eliminating caffeine and stress in her life appears to have significantly improved.  Her blood pressure.  I will discontinue the amlodipine and she was only on 2.5 mg.  She will continue lisinopril, HCTZ, 10 mg/6.25 as directed.  No further cardiac testing is planned with a normal stress test and echocardiogram.  She will follow with her primary care physician, and we will see her on a when necessary basis.   Current medicines are reviewed  at length with the patient today.    Labs/ tests ordered today include:  No orders of the defined types were placed in this encounter.     Disposition:   FU with PRN   Signed, Joni Reining, NP  07/15/2015 4:03 PM    [facility] [address] [place], [place] [address] Phone: [phone]; Fax: [phone]

## 2015-07-26 DIAGNOSIS — I10 Essential (primary) hypertension: Secondary | ICD-10-CM | POA: Diagnosis not present

## 2015-07-26 DIAGNOSIS — R42 Dizziness and giddiness: Secondary | ICD-10-CM | POA: Diagnosis not present

## 2015-07-26 DIAGNOSIS — Z1389 Encounter for screening for other disorder: Secondary | ICD-10-CM | POA: Diagnosis not present

## 2015-07-26 DIAGNOSIS — I209 Angina pectoris, unspecified: Secondary | ICD-10-CM | POA: Diagnosis not present

## 2015-07-26 DIAGNOSIS — E782 Mixed hyperlipidemia: Secondary | ICD-10-CM | POA: Diagnosis not present

## 2015-10-21 DIAGNOSIS — Z1389 Encounter for screening for other disorder: Secondary | ICD-10-CM | POA: Diagnosis not present

## 2015-10-21 DIAGNOSIS — I10 Essential (primary) hypertension: Secondary | ICD-10-CM | POA: Diagnosis not present

## 2015-10-21 DIAGNOSIS — E782 Mixed hyperlipidemia: Secondary | ICD-10-CM | POA: Diagnosis not present

## 2016-01-24 ENCOUNTER — Other Ambulatory Visit (HOSPITAL_COMMUNITY): Payer: Self-pay | Admitting: Registered Nurse

## 2016-01-24 ENCOUNTER — Ambulatory Visit (HOSPITAL_COMMUNITY)
Admission: RE | Admit: 2016-01-24 | Discharge: 2016-01-24 | Disposition: A | Discharge status: Home / Self Care | Payer: Medicare Other | Source: Ambulatory Visit | Attending: Registered Nurse | Admitting: Registered Nurse

## 2016-01-24 DIAGNOSIS — Z1389 Encounter for screening for other disorder: Secondary | ICD-10-CM | POA: Diagnosis not present

## 2016-01-24 DIAGNOSIS — M25551 Pain in right hip: Secondary | ICD-10-CM

## 2016-01-24 DIAGNOSIS — M1611 Unilateral primary osteoarthritis, right hip: Secondary | ICD-10-CM | POA: Insufficient documentation

## 2016-01-24 DIAGNOSIS — I10 Essential (primary) hypertension: Secondary | ICD-10-CM | POA: Diagnosis not present

## 2016-01-24 DIAGNOSIS — S79911A Unspecified injury of right hip, initial encounter: Secondary | ICD-10-CM | POA: Insufficient documentation

## 2016-01-24 DIAGNOSIS — E6609 Other obesity due to excess calories: Secondary | ICD-10-CM | POA: Diagnosis not present

## 2016-01-24 DIAGNOSIS — E782 Mixed hyperlipidemia: Secondary | ICD-10-CM | POA: Diagnosis not present

## 2016-05-07 DIAGNOSIS — M5410 Radiculopathy, site unspecified: Secondary | ICD-10-CM | POA: Diagnosis not present

## 2016-08-14 ENCOUNTER — Other Ambulatory Visit (HOSPITAL_COMMUNITY): Payer: Self-pay | Admitting: Registered Nurse

## 2016-08-14 ENCOUNTER — Ambulatory Visit (HOSPITAL_COMMUNITY)
Admission: RE | Admit: 2016-08-14 | Discharge: 2016-08-14 | Disposition: A | Discharge status: Home / Self Care | Payer: Medicare Other | Source: Ambulatory Visit | Attending: Registered Nurse | Admitting: Registered Nurse

## 2016-08-14 DIAGNOSIS — X58XXXA Exposure to other specified factors, initial encounter: Secondary | ICD-10-CM | POA: Insufficient documentation

## 2016-08-14 DIAGNOSIS — M79676 Pain in unspecified toe(s): Secondary | ICD-10-CM | POA: Diagnosis not present

## 2016-08-14 DIAGNOSIS — M19041 Primary osteoarthritis, right hand: Secondary | ICD-10-CM | POA: Diagnosis not present

## 2016-08-14 DIAGNOSIS — M79641 Pain in right hand: Secondary | ICD-10-CM | POA: Diagnosis not present

## 2016-08-14 DIAGNOSIS — L608 Other nail disorders: Secondary | ICD-10-CM | POA: Diagnosis not present

## 2016-08-14 DIAGNOSIS — S6991XA Unspecified injury of right wrist, hand and finger(s), initial encounter: Secondary | ICD-10-CM | POA: Diagnosis not present

## 2016-08-30 DIAGNOSIS — H6591 Unspecified nonsuppurative otitis media, right ear: Secondary | ICD-10-CM | POA: Diagnosis not present

## 2016-09-04 IMAGING — DX DG CHEST 2V
2 series · 2 of 2 positions shown · non-contrast
Comparison: None.

CLINICAL DATA: Chest pain. Chest discomfort and hypertension for 2
months.

EXAM:
CHEST  2 VIEW

[chest pa]
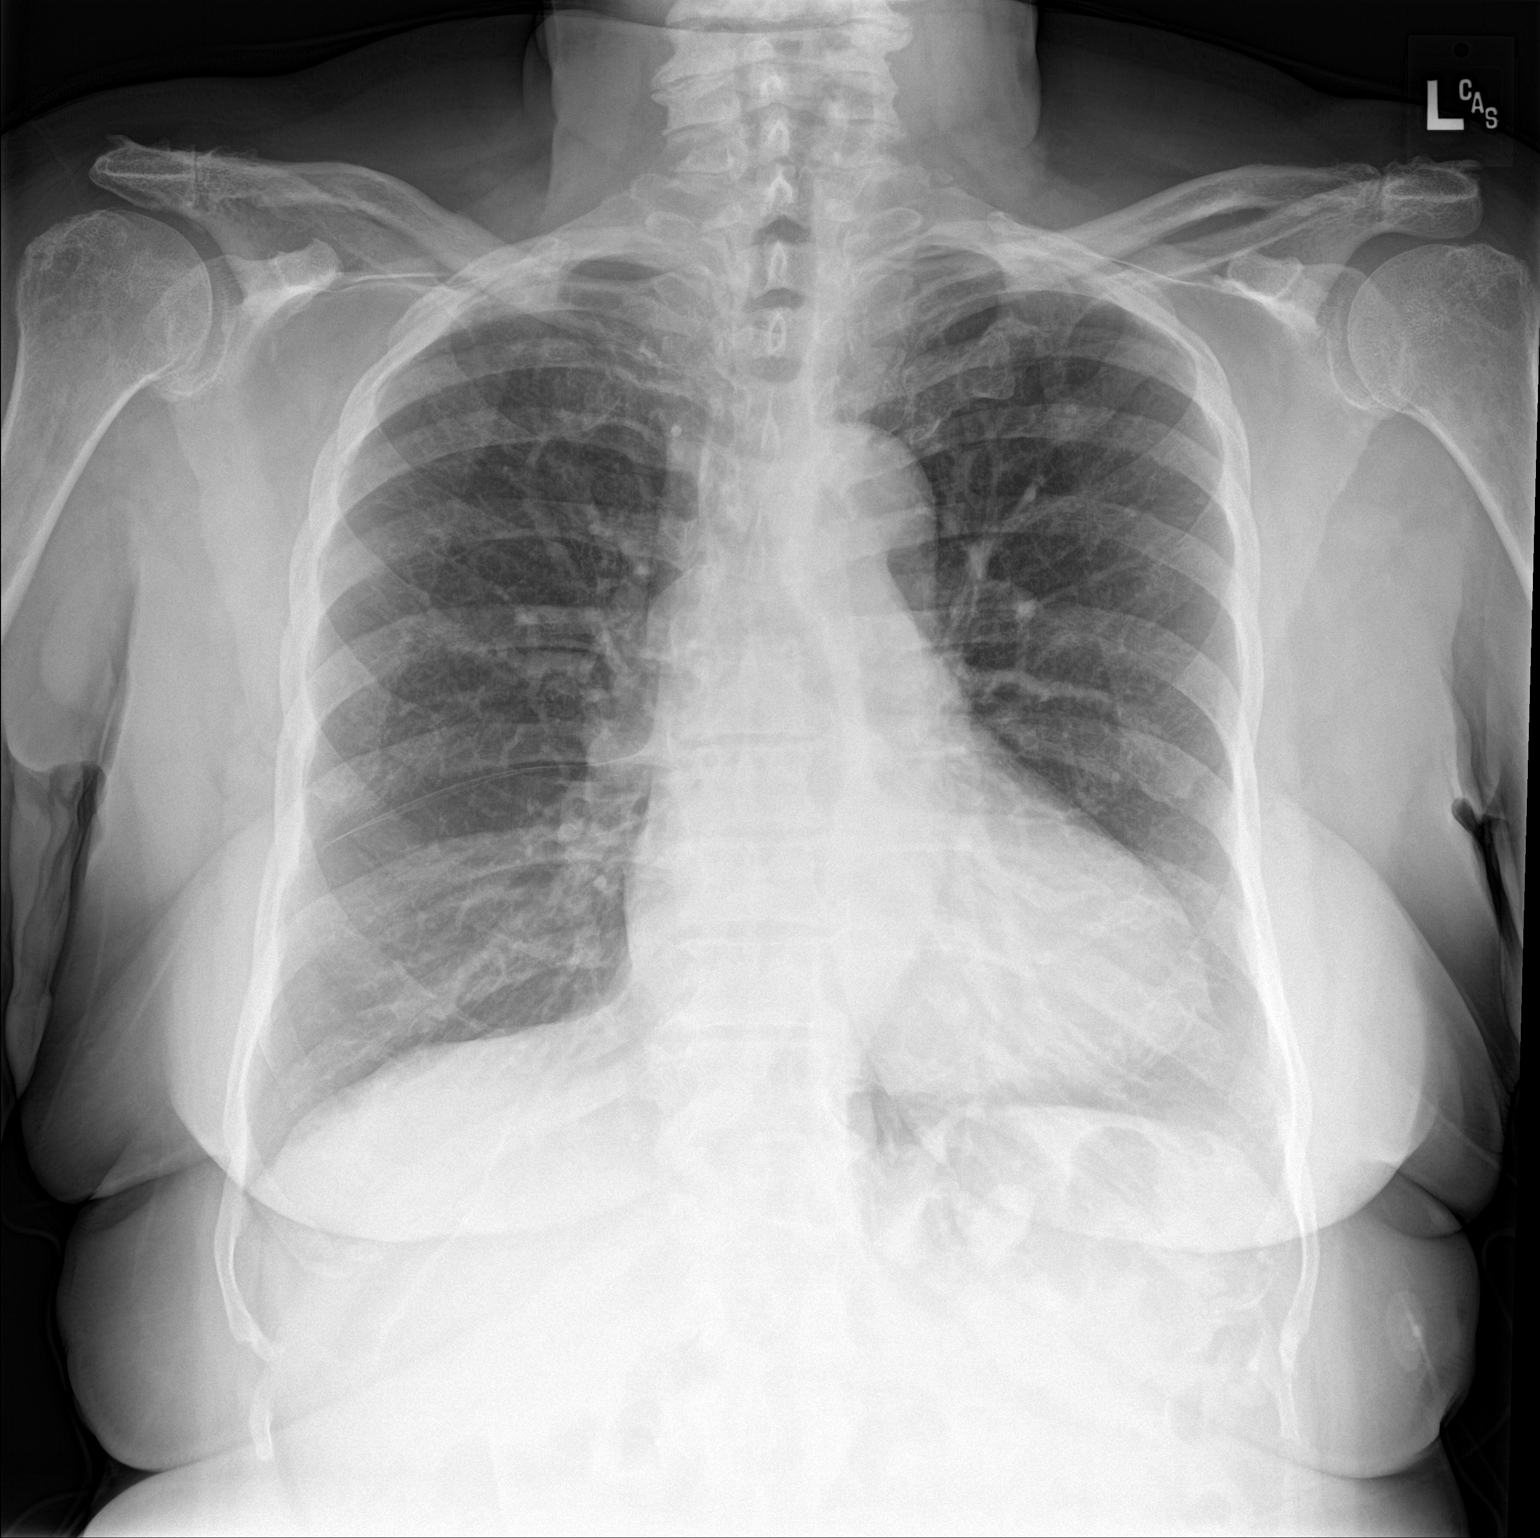

[chest lat]
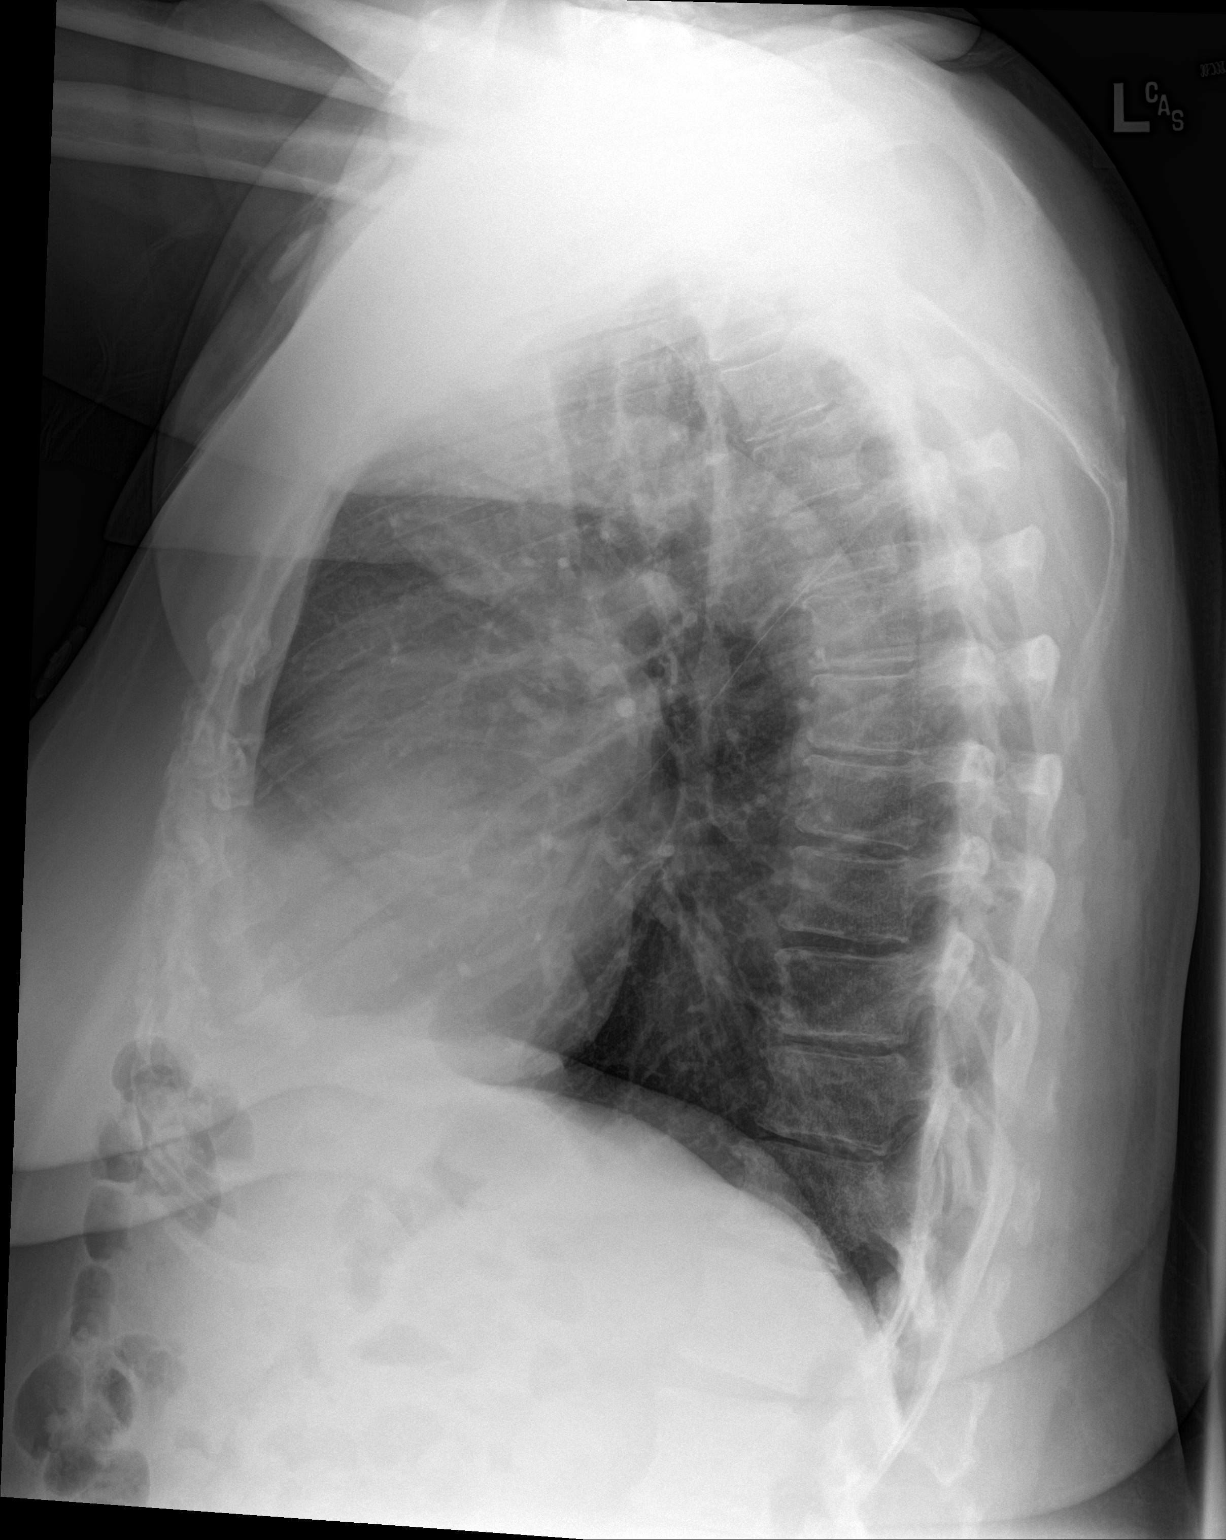

[2 of 2 positions shown; findings below may reference images not displayed]

FINDINGS: Heart at the upper limits normal in size, there is tortuosity of the
thoracic aorta. Pulmonary vasculature is normal. No consolidation,
pleural effusion, or pneumothorax. No acute osseous abnormalities
are seen.
IMPRESSION: No acute pulmonary process.

## 2016-09-05 DIAGNOSIS — M79672 Pain in left foot: Secondary | ICD-10-CM | POA: Diagnosis not present

## 2016-09-05 DIAGNOSIS — B351 Tinea unguium: Secondary | ICD-10-CM | POA: Diagnosis not present

## 2016-09-05 DIAGNOSIS — M79671 Pain in right foot: Secondary | ICD-10-CM | POA: Diagnosis not present

## 2016-09-12 ENCOUNTER — Other Ambulatory Visit (HOSPITAL_COMMUNITY): Payer: Self-pay | Admitting: Family Medicine

## 2016-09-12 DIAGNOSIS — Z1231 Encounter for screening mammogram for malignant neoplasm of breast: Secondary | ICD-10-CM

## 2016-09-19 ENCOUNTER — Ambulatory Visit (HOSPITAL_COMMUNITY)
Admission: RE | Admit: 2016-09-19 | Discharge: 2016-09-19 | Disposition: A | Discharge status: Home / Self Care | Payer: Medicare Other | Source: Ambulatory Visit | Attending: Family Medicine | Admitting: Family Medicine

## 2016-09-19 DIAGNOSIS — Z1231 Encounter for screening mammogram for malignant neoplasm of breast: Secondary | ICD-10-CM | POA: Insufficient documentation

## 2016-10-15 DIAGNOSIS — Z1389 Encounter for screening for other disorder: Secondary | ICD-10-CM | POA: Diagnosis not present

## 2016-10-15 DIAGNOSIS — E782 Mixed hyperlipidemia: Secondary | ICD-10-CM | POA: Diagnosis not present

## 2016-10-15 DIAGNOSIS — T148XXA Other injury of unspecified body region, initial encounter: Secondary | ICD-10-CM | POA: Diagnosis not present

## 2016-10-15 DIAGNOSIS — I10 Essential (primary) hypertension: Secondary | ICD-10-CM | POA: Diagnosis not present

## 2016-10-31 DIAGNOSIS — B351 Tinea unguium: Secondary | ICD-10-CM | POA: Diagnosis not present

## 2016-10-31 DIAGNOSIS — M79672 Pain in left foot: Secondary | ICD-10-CM | POA: Diagnosis not present

## 2016-10-31 DIAGNOSIS — M79671 Pain in right foot: Secondary | ICD-10-CM | POA: Diagnosis not present

## 2016-11-10 LAB — BASIC METABOLIC PANEL: Glucose: 131

## 2016-11-12 ENCOUNTER — Encounter (HOSPITAL_COMMUNITY): Payer: Self-pay | Admitting: Emergency Medicine

## 2016-11-12 ENCOUNTER — Emergency Department (HOSPITAL_COMMUNITY): Payer: Medicare Other

## 2016-11-12 ENCOUNTER — Emergency Department (HOSPITAL_COMMUNITY)
Admission: EM | Admit: 2016-11-12 | Discharge: 2016-11-12 | Disposition: A | Discharge status: Home / Self Care | Payer: Medicare Other | Attending: Emergency Medicine | Admitting: Emergency Medicine

## 2016-11-12 DIAGNOSIS — R358 Other polyuria: Secondary | ICD-10-CM | POA: Diagnosis not present

## 2016-11-12 DIAGNOSIS — H538 Other visual disturbances: Secondary | ICD-10-CM | POA: Insufficient documentation

## 2016-11-12 DIAGNOSIS — I10 Essential (primary) hypertension: Secondary | ICD-10-CM | POA: Diagnosis not present

## 2016-11-12 LAB — BASIC METABOLIC PANEL
Anion gap: 6 (ref 5–15)
BUN: 11 mg/dL (ref 6–20)
CHLORIDE: 104 mmol/L (ref 101–111)
CO2: 30 mmol/L (ref 22–32)
Calcium: 9.1 mg/dL (ref 8.9–10.3)
Creatinine, Ser: 0.76 mg/dL (ref 0.44–1.00)
GFR calc Af Amer: >60 mL/min (ref >60)
GFR calc non Af Amer: >60 mL/min (ref >60)
GLUCOSE: 91 mg/dL (ref 65–99)
POTASSIUM: 3.6 mmol/L (ref 3.5–5.1)
Sodium: 140 mmol/L (ref 135–145)

## 2016-11-12 LAB — CBC WITH DIFFERENTIAL/PLATELET
BASOS ABS: 0 10*3/uL (ref 0.0–0.1)
Basophils Relative: 0 %
EOS ABS: 0.1 10*3/uL (ref 0.0–0.7)
Eosinophils Relative: 3 %
HCT: 34.6 % — ABNORMAL LOW (ref 36.0–46.0)
HEMOGLOBIN: 11.3 g/dL — AB (ref 12.0–15.0)
Lymphocytes Relative: 43 %
Lymphs Abs: 2.1 10*3/uL (ref 0.7–4.0)
MCH: 28.5 pg (ref 26.0–34.0)
MCHC: 32.7 g/dL (ref 30.0–36.0)
MCV: 87.4 fL (ref 78.0–100.0)
MONO ABS: 0.3 10*3/uL (ref 0.1–1.0)
MONOS PCT: 6 %
NEUTROS ABS: 2.4 10*3/uL (ref 1.7–7.7)
Neutrophils Relative %: 48 %
Platelets: 152 10*3/uL (ref 150–400)
RBC: 3.96 MIL/uL (ref 3.87–5.11)
RDW: 14 % (ref 11.5–15.5)
WBC: 4.8 10*3/uL (ref 4.0–10.5)

## 2016-11-12 LAB — URINALYSIS, ROUTINE W REFLEX MICROSCOPIC
Bilirubin Urine: NEGATIVE
GLUCOSE, UA: NEGATIVE mg/dL
Hgb urine dipstick: NEGATIVE
Ketones, ur: NEGATIVE mg/dL
Leukocytes, UA: NEGATIVE
Nitrite: NEGATIVE
PH: 6 (ref 5.0–8.0)
Protein, ur: NEGATIVE mg/dL
SPECIFIC GRAVITY, URINE: 1.003 — AB (ref 1.005–1.030)

## 2016-11-12 MED ORDER — LISINOPRIL 5 MG PO TABS
5.0000 mg | ORAL_TABLET | Freq: Once | ORAL | Status: AC
  Administered 2016-11-12: 5 mg via ORAL
  Filled 2016-11-12: qty 1

## 2016-11-12 NOTE — ED Triage Notes (Signed)
Pt states felt "not like Stefan" this am. Checked bp and was high so took and extra 5mg  of lisinopril. States feeling never went away and feels like her head is full. Nad. A/o.

## 2016-11-12 NOTE — ED Provider Notes (Signed)
[facility]-EMERGENCY DEPT Provider Note   CSN: [id number] Arrival date & time: 11/12/16  1759     History   Chief Complaint Chief Complaint  Patient presents with  . Hypertension    HPI Mariah Ross is a 71 y.o. female.  H/o HTN, started not feeling right earliert today and had elevated BP, took a 5mg  lisinopril at home which helped with blurry vision and BP improved some but still slightly high so went to pharmacy to have it rechecked and it was high so sent here for eval.  No CP, SOB, new leg swelling, abdominal pain, back pain, numbness, focal weakness, headache, persistent vision changes. Has had increased urination as she states she has been drinking more water to help with headaches.  No other associated or modifying factors.     Hypertension  This is a chronic problem. The current episode started more than 1 week ago. The problem occurs constantly. The problem has been gradually improving.    Past Medical History:  Diagnosis Date  . Hypertension     Patient Active Problem List   Diagnosis Date Noted  . Essential hypertension 07/15/2015    Past Surgical History:  Procedure Laterality Date  . TONSILLECTOMY    . TUBAL LIGATION      OB History    No data available       Home Medications    Prior to Admission medications   Medication Sig Start Date End Date Taking? Authorizing Provider  lisinopril (PRINIVIL,ZESTRIL) 10 MG tablet Take 15 mg by mouth daily. TAKES ONE LISINOPRIL 10MG  TABLET IN ADDITION TO LISINOPRIL 5MG  FOR A TOTAL OF 15MG  DAILY   Yes [provider]  lisinopril (PRINIVIL,ZESTRIL) 5 MG tablet TAKE ONE TABLET DAILY IN ADDITION TO ONE LISINOPRIL 10MG  TABLET FOR A TOTAL OF 15MG  DAILY 10/15/16  Yes [provider]    Family History Family History  Problem Relation Age of Onset  . Hypertension Mother   . Cancer Father     Social History Social History  Substance Use Topics  . Smoking status: Never Smoker  . Smokeless  tobacco: Never Used  . Alcohol use 0.0 oz/week     Allergies   Patient has no known allergies.   Review of Systems Review of Systems  All other systems reviewed and are negative.    Physical Exam Updated Vital Signs BP (!) 174/96   Pulse 60   Temp 98 F (36.7 C) (Oral)   Resp 18   Ht 5\' 5"  (1.651 m)   Wt 89.8 kg (198 lb)   SpO2 98%   BMI 32.95 kg/m   Physical Exam  Constitutional: She is oriented to person, place, and time. She appears well-developed and well-nourished.  HENT:  Head: Normocephalic and atraumatic.  Eyes: Conjunctivae and EOM are normal. Pupils are equal, round, and reactive to light.  Neck: Normal range of motion.  Cardiovascular: Normal rate and regular rhythm.   Pulmonary/Chest: Effort normal. No stridor. No respiratory distress. She has no wheezes.  Abdominal: Soft. Bowel sounds are normal. She exhibits no distension.  Musculoskeletal: Normal range of motion. She exhibits no edema or deformity.  Neurological: She is alert and oriented to person, place, and time. She displays normal reflexes. No cranial nerve deficit. Coordination normal.  No altered mental status, able to give full seemingly accurate history.  Face is symmetric, EOM's intact, pupils equal and reactive, vision intact, tongue and uvula midline without deviation Upper and Lower extremity motor 5/5, intact pain perception  in distal extremities, 2+ reflexes in biceps, patella and achilles tendons. Finger to nose normal, heel to shin normal. Walks without assistance or evident ataxia.   Skin: Skin is warm and dry.  Nursing note and vitals reviewed.    ED Treatments / Results  Labs (all labs ordered are listed, but only abnormal results are displayed) Labs Reviewed  CBC WITH DIFFERENTIAL/PLATELET - Abnormal; Notable for the following:       Result Value   Hemoglobin 11.3 (*)    HCT 34.6 (*)    All other components within normal limits  URINALYSIS, ROUTINE W REFLEX MICROSCOPIC -  Abnormal; Notable for the following:    Color, Urine COLORLESS (*)    Specific Gravity, Urine 1.003 (*)    All other components within normal limits  BASIC METABOLIC PANEL    EKG  EKG Interpretation None       Radiology Ct Head Wo Contrast  Result Date: 11/12/2016 CLINICAL DATA:  Intermittent pressure in the head for several months. Vision became blurry. History of hypertension. EXAM: CT HEAD WITHOUT CONTRAST TECHNIQUE: Contiguous axial images were obtained from the base of the skull through the vertex without intravenous contrast. COMPARISON:  None. FINDINGS: BRAIN: No significant atrophy. Minimal age appropriate involutional changes of the brain. No acute large vascular territory infarcts. No intracranial hemorrhage, hydrocephalus or focal intra-axial mass. No abnormal extra-axial fluid collections. Basal cisterns are not effaced and midline. VASCULAR: No hyperdense vessels nor unexpected calcifications. SKULL: No skull fracture. No significant scalp soft tissue swelling. SINUSES/ORBITS: The mastoid air-cells are clear. The included paranasal sinuses are well-aerated.The included ocular globes and orbital contents are non-suspicious. OTHER: None. IMPRESSION: No acute intracranial abnormality. Electronically Signed   By: Tollie Ethavid  Kwon M.D.   On: 11/12/2016 19:21    Procedures Procedures (including critical care time)  Medications Ordered in ED Medications  lisinopril (PRINIVIL,ZESTRIL) tablet 5 mg (5 mg Oral Given 11/12/16 1904)     Initial Impression / Assessment and Plan / ED Course  I have reviewed the triage vital signs and the nursing notes.  Pertinent labs & imaging results that were available during my care of the patient were reviewed by me and considered in my medical decision making (see chart for details).    At this point, relatively asymptomatic HTN, but did have blurry vision and now with polyuria so will do focused workup. Will also redose lisinopril for patient  satisfaction.    Asymptomatic, still with elevated BP but no s/s end organ damage, stable for dc to continue logging BP's, fu w/ PCP for better control.   Final Clinical Impressions(s) / ED Diagnoses   Final diagnoses:  Hypertension, unspecified type     Marily MemosMesner, Lakeesha Fontanilla, MD 11/12/16 2258

## 2016-11-15 DIAGNOSIS — I10 Essential (primary) hypertension: Secondary | ICD-10-CM | POA: Diagnosis not present

## 2016-11-15 DIAGNOSIS — Z1389 Encounter for screening for other disorder: Secondary | ICD-10-CM | POA: Diagnosis not present

## 2016-12-07 DIAGNOSIS — M5412 Radiculopathy, cervical region: Secondary | ICD-10-CM | POA: Diagnosis not present

## 2016-12-07 DIAGNOSIS — M542 Cervicalgia: Secondary | ICD-10-CM | POA: Diagnosis not present

## 2017-04-15 DIAGNOSIS — E782 Mixed hyperlipidemia: Secondary | ICD-10-CM | POA: Diagnosis not present

## 2017-04-15 DIAGNOSIS — I10 Essential (primary) hypertension: Secondary | ICD-10-CM | POA: Diagnosis not present

## 2017-04-15 DIAGNOSIS — Z0000 Encounter for general adult medical examination without abnormal findings: Secondary | ICD-10-CM | POA: Diagnosis not present

## 2017-04-15 DIAGNOSIS — Z23 Encounter for immunization: Secondary | ICD-10-CM | POA: Diagnosis not present

## 2017-04-15 DIAGNOSIS — R7309 Other abnormal glucose: Secondary | ICD-10-CM | POA: Diagnosis not present

## 2017-04-15 DIAGNOSIS — Z1389 Encounter for screening for other disorder: Secondary | ICD-10-CM | POA: Diagnosis not present

## 2017-08-23 DIAGNOSIS — M79675 Pain in left toe(s): Secondary | ICD-10-CM | POA: Diagnosis not present

## 2017-08-23 DIAGNOSIS — Z1389 Encounter for screening for other disorder: Secondary | ICD-10-CM | POA: Diagnosis not present

## 2017-08-23 DIAGNOSIS — M25551 Pain in right hip: Secondary | ICD-10-CM | POA: Diagnosis not present

## 2017-10-01 ENCOUNTER — Emergency Department (HOSPITAL_COMMUNITY)
Admission: EM | Admit: 2017-10-01 | Discharge: 2017-10-01 | Disposition: A | Discharge status: Home / Self Care | Payer: Medicare Other | Attending: Emergency Medicine | Admitting: Emergency Medicine

## 2017-10-01 ENCOUNTER — Other Ambulatory Visit: Payer: Self-pay

## 2017-10-01 ENCOUNTER — Emergency Department (HOSPITAL_COMMUNITY): Payer: Medicare Other

## 2017-10-01 ENCOUNTER — Encounter (HOSPITAL_COMMUNITY): Payer: Self-pay | Admitting: Emergency Medicine

## 2017-10-01 DIAGNOSIS — R1013 Epigastric pain: Secondary | ICD-10-CM | POA: Diagnosis not present

## 2017-10-01 DIAGNOSIS — I10 Essential (primary) hypertension: Secondary | ICD-10-CM | POA: Diagnosis not present

## 2017-10-01 DIAGNOSIS — K219 Gastro-esophageal reflux disease without esophagitis: Secondary | ICD-10-CM

## 2017-10-01 DIAGNOSIS — R1011 Right upper quadrant pain: Secondary | ICD-10-CM | POA: Diagnosis not present

## 2017-10-01 DIAGNOSIS — R0789 Other chest pain: Secondary | ICD-10-CM | POA: Diagnosis not present

## 2017-10-01 DIAGNOSIS — R079 Chest pain, unspecified: Secondary | ICD-10-CM | POA: Insufficient documentation

## 2017-10-01 DIAGNOSIS — Z79899 Other long term (current) drug therapy: Secondary | ICD-10-CM | POA: Insufficient documentation

## 2017-10-01 DIAGNOSIS — I459 Conduction disorder, unspecified: Secondary | ICD-10-CM | POA: Diagnosis not present

## 2017-10-01 LAB — HEPATIC FUNCTION PANEL
ALT: 26 U/L (ref 14–54)
AST: 23 U/L (ref 15–41)
Albumin: 3.9 g/dL (ref 3.5–5.0)
Alkaline Phosphatase: 67 U/L (ref 38–126)
BILIRUBIN DIRECT: 0.1 mg/dL (ref 0.1–0.5)
BILIRUBIN TOTAL: 0.7 mg/dL (ref 0.3–1.2)
Indirect Bilirubin: 0.6 mg/dL (ref 0.3–0.9)
Total Protein: 7.4 g/dL (ref 6.5–8.1)

## 2017-10-01 LAB — BASIC METABOLIC PANEL
ANION GAP: 6 (ref 5–15)
BUN: 13 mg/dL (ref 6–20)
CHLORIDE: 102 mmol/L (ref 101–111)
CO2: 33 mmol/L — AB (ref 22–32)
Calcium: 9.4 mg/dL (ref 8.9–10.3)
Creatinine, Ser: 0.74 mg/dL (ref 0.44–1.00)
GFR calc non Af Amer: >60 mL/min (ref >60)
Glucose, Bld: 98 mg/dL (ref 65–99)
POTASSIUM: 3.9 mmol/L (ref 3.5–5.1)
Sodium: 141 mmol/L (ref 135–145)

## 2017-10-01 LAB — TROPONIN I

## 2017-10-01 LAB — CBC
HCT: 37.5 % (ref 36.0–46.0)
HEMOGLOBIN: 11.8 g/dL — AB (ref 12.0–15.0)
MCH: 27.3 pg (ref 26.0–34.0)
MCHC: 31.5 g/dL (ref 30.0–36.0)
MCV: 86.8 fL (ref 78.0–100.0)
Platelets: 193 10*3/uL (ref 150–400)
RBC: 4.32 MIL/uL (ref 3.87–5.11)
RDW: 14.7 % (ref 11.5–15.5)
WBC: 5.4 10*3/uL (ref 4.0–10.5)

## 2017-10-01 LAB — LIPASE, BLOOD: Lipase: 29 U/L (ref 11–51)

## 2017-10-01 MED ORDER — OMEPRAZOLE 20 MG PO CPDR
20.0000 mg | DELAYED_RELEASE_CAPSULE | Freq: Two times a day (BID) | ORAL | 1 refills | Status: DC
Start: 2017-10-01 — End: 2018-10-29

## 2017-10-01 NOTE — ED Provider Notes (Signed)
[facility] EMERGENCY DEPARTMENT Provider Note   CSN: [id number] Arrival date & time: 10/01/17  0865     History   Chief Complaint Chief Complaint  Patient presents with  . Chest Pain    HPI Mariah Ross is a 72 y.o. female.  Chief complaint is epigastric pain and "bloating".  HPI: 72 year old female.  Presents after being evaluated at her primary care physician's office and referred here.  She describes a few weeks of feeling epigastric discomfort.  She states it feels like a full and bloated feeling.  It is worse when she sits down.  She does not get symptoms at night or with laying supine.  She states that if she takes off her bra and her pantyhose it relieves her symptoms.  Does not have any food intolerances.  Does note that she has eaten less due to poor appetite but does not have frank fatty food discomfort, food aversion, or biliary symptoms.  Dark urine or light stools.  No vomiting.  No diarrhea or constipation.  No fevers or chills.  No chest pain.  No exertional symptoms.  No shortness of breath.  Past Medical History:  Diagnosis Date  . Hypertension     Patient Active Problem List   Diagnosis Date Noted  . Essential hypertension 07/15/2015    Past Surgical History:  Procedure Laterality Date  . SKIN BIOPSY    . TONSILLECTOMY    . TUBAL LIGATION       OB History   None      Home Medications    Prior to Admission medications   Medication Sig Start Date End Date Taking? Authorizing Provider  diltiazem (TIAZAC) 180 MG 24 hr capsule Take 1 capsule at bedtime 08/06/17  Yes [provider]  lisinopril (PRINIVIL,ZESTRIL) 10 MG tablet Take 10 mg by mouth daily.    Yes [provider]  omeprazole (PRILOSEC) 20 MG capsule Take 1 capsule (20 mg total) by mouth 2 (two) times daily. 10/01/17   Rolland Porter, MD    Family History Family History  Problem Relation Age of Onset  . Hypertension Mother   . Cancer Father     Social  History Social History   Tobacco Use  . Smoking status: Never Smoker  . Smokeless tobacco: Never Used  Substance Use Topics  . Alcohol use: Yes    Alcohol/week: 0.0 oz    Comment: socially   . Drug use: No     Allergies   Patient has no known allergies.   Review of Systems Review of Systems  Constitutional: Negative for appetite change, chills, diaphoresis, fatigue and fever.  HENT: Negative for mouth sores, sore throat and trouble swallowing.   Eyes: Negative for visual disturbance.  Respiratory: Negative for cough, chest tightness, shortness of breath and wheezing.   Cardiovascular: Negative for chest pain.  Gastrointestinal: Positive for abdominal pain. Negative for abdominal distention, diarrhea, nausea and vomiting.       "Bloating".  Endocrine: Negative for polydipsia, polyphagia and polyuria.  Genitourinary: Negative for dysuria, frequency and hematuria.  Musculoskeletal: Negative for gait problem.  Skin: Negative for color change, pallor and rash.  Neurological: Negative for dizziness, syncope, light-headedness and headaches.  Hematological: Does not bruise/bleed easily.  Psychiatric/Behavioral: Negative for behavioral problems and confusion.     Physical Exam Updated Vital Signs BP 139/86   Pulse 79   Temp 99.4 F (37.4 C) (Oral)   Resp 17   Ht  (1.651 m)   Wt  90.3 kg (199 lb)   SpO2 99%   BMI 33.12 kg/m   Physical Exam  Constitutional: She is oriented to person, place, and time. She appears well-developed and well-nourished. No distress.  72 year old [place] American female.  Appears younger than her stated age.  Is pleasant.  No distress noted.  HENT:  Head: Normocephalic.  Eyes: Pupils are equal, round, and reactive to light. Conjunctivae are normal. No scleral icterus.  No scleral icterus.  Conjunctive are not pale.  Neck: Normal range of motion. Neck supple. No thyromegaly present.  Cardiovascular: Normal rate and regular rhythm. Exam  reveals no gallop and no friction rub.  No murmur heard. Pulmonary/Chest: Effort normal and breath sounds normal. No respiratory distress. She has no wheezes. She has no rales.  Abdominal: Soft. Bowel sounds are normal. She exhibits no distension. There is no tenderness. There is no rebound.  No specific right upper quadrant tenderness.  Negative Murphy sign.  Points to her epigastrium and right upper quadrant as her area where she feels her bloated feeling.  Musculoskeletal: Normal range of motion.  Neurological: She is alert and oriented to person, place, and time.  Skin: Skin is warm and dry. No rash noted.  Psychiatric: She has a normal mood and affect. Her behavior is normal.     ED Treatments / Results  Labs (all labs ordered are listed, but only abnormal results are displayed) Labs Reviewed  BASIC METABOLIC PANEL - Abnormal; Notable for the following components:      Result Value   CO2 33 (*)    All other components within normal limits  CBC - Abnormal; Notable for the following components:   Hemoglobin 11.8 (*)    All other components within normal limits  TROPONIN I  HEPATIC FUNCTION PANEL  LIPASE, BLOOD    EKG None  Radiology Dg Chest 2 View  Result Date: 10/01/2017 CLINICAL DATA:  Chest pain, epigastric pain for 2 weeks EXAM: CHEST - 2 VIEW COMPARISON:  06/09/2015 FINDINGS: Mild cardiomegaly. Lungs are clear. No effusions or edema. No acute bony abnormality. IMPRESSION: Mild cardiomegaly.  No active disease. Electronically Signed   By: Charlett Nose M.D.   On: 10/01/2017 10:10   US Abdomen Limited Ruq  Result Date: 10/01/2017 CLINICAL DATA:  Right upper quadrant discomfort, abdominal bloating, indigestion symptoms for the past 2 weeks EXAM: ULTRASOUND ABDOMEN LIMITED RIGHT UPPER QUADRANT COMPARISON:  None. FINDINGS: Gallbladder: No gallstones or wall thickening visualized. No sonographic Murphy sign noted by sonographer. Common bile duct: Diameter: 1.7 mm Liver: No  focal lesion identified. Within normal limits in parenchymal echogenicity. Portal vein is patent on color Doppler imaging with normal direction of blood flow towards the liver. IMPRESSION: Normal appearance of the gallbladder, common bile duct, and liver. If there are clinical concerns of chronic gallbladder dysfunction, a nuclear medicine hepatobiliary scan with gallbladder ejection fraction determination may be useful. Electronically Signed   By: David  [place] M.D.   On: 10/01/2017 13:36    Procedures Procedures (including critical care time)  Medications Ordered in ED Medications - No data to display   Initial Impression / Assessment and Plan / ED Course  I have reviewed the triage vital signs and the nursing notes.  Pertinent labs & imaging results that were available during my care of the patient were reviewed by me and considered in my medical decision making (see chart for details).    EKG: Not able to be placed in views.  Interpreted by myself.  Sinus  rhythm.  No acute or ischemic changes.  No injury or ectopy.  No Q waves or ST changes.  Sinus paced rhythm.  Right upper quadrant ultrasound negative.  Normal hepatobiliary and reticulocyte enzymes.  Troponin negative.  Discussion symptoms most consistent with acid-base phenomenon reflux.  Asked to avoid alcohol, tobacco, caffeine, anti-inflammatories.  No large meals.  PPI twice daily until improving and then daily.  GI referral.  Contact her primary care physician.  Final Clinical Impressions(s) / ED Diagnoses   Final diagnoses:  RUQ pain  Epigastric pain  Gastroesophageal reflux disease, esophagitis presence not specified    ED Discharge Orders        Ordered    omeprazole (PRILOSEC) 20 MG capsule  2 times daily     10/01/17 1418       Rolland Porter, MD 10/01/17 1431

## 2017-10-01 NOTE — Discharge Instructions (Addendum)
Small meals. Avoid alcohol, caffeine, anti-inflammatory medications. Prilosed twice per day until improving, then decrease to once per day.

## 2017-10-01 NOTE — ED Triage Notes (Signed)
Pt c/o intermittent epigastric pain that radiates to chest. Pt states she was sent over by Dr. Sherwood Gambler for evaluation. Denies SOB, n/v, or radiation of pain. No hx of GERD.

## 2017-10-01 NOTE — ED Notes (Signed)
Pt reports episodes of abdominal bloating. States not a pain, just discomfort.Points to epigastric area   Reports removing excess clothing relieves discomfort. Pt reports intermittently occurs

## 2017-11-01 DIAGNOSIS — Z1389 Encounter for screening for other disorder: Secondary | ICD-10-CM | POA: Diagnosis not present

## 2017-11-01 DIAGNOSIS — Z79899 Other long term (current) drug therapy: Secondary | ICD-10-CM | POA: Diagnosis not present

## 2017-11-01 DIAGNOSIS — R58 Hemorrhage, not elsewhere classified: Secondary | ICD-10-CM | POA: Diagnosis not present

## 2017-11-01 DIAGNOSIS — R6889 Other general symptoms and signs: Secondary | ICD-10-CM | POA: Diagnosis not present

## 2017-11-01 DIAGNOSIS — K219 Gastro-esophageal reflux disease without esophagitis: Secondary | ICD-10-CM | POA: Diagnosis not present

## 2017-11-01 DIAGNOSIS — I10 Essential (primary) hypertension: Secondary | ICD-10-CM | POA: Diagnosis not present

## 2018-02-26 DIAGNOSIS — Z1389 Encounter for screening for other disorder: Secondary | ICD-10-CM | POA: Diagnosis not present

## 2018-02-26 DIAGNOSIS — M25569 Pain in unspecified knee: Secondary | ICD-10-CM | POA: Diagnosis not present

## 2018-02-26 DIAGNOSIS — R7309 Other abnormal glucose: Secondary | ICD-10-CM | POA: Diagnosis not present

## 2018-02-26 DIAGNOSIS — K219 Gastro-esophageal reflux disease without esophagitis: Secondary | ICD-10-CM | POA: Diagnosis not present

## 2018-02-26 DIAGNOSIS — I209 Angina pectoris, unspecified: Secondary | ICD-10-CM | POA: Diagnosis not present

## 2018-02-26 DIAGNOSIS — E781 Pure hyperglyceridemia: Secondary | ICD-10-CM | POA: Diagnosis not present

## 2018-02-26 DIAGNOSIS — I10 Essential (primary) hypertension: Secondary | ICD-10-CM | POA: Diagnosis not present

## 2018-02-26 DIAGNOSIS — Z0001 Encounter for general adult medical examination with abnormal findings: Secondary | ICD-10-CM | POA: Diagnosis not present

## 2018-02-26 DIAGNOSIS — R002 Palpitations: Secondary | ICD-10-CM | POA: Diagnosis not present

## 2018-03-10 ENCOUNTER — Other Ambulatory Visit: Payer: Self-pay | Admitting: Family Medicine

## 2018-03-10 DIAGNOSIS — Z1231 Encounter for screening mammogram for malignant neoplasm of breast: Secondary | ICD-10-CM

## 2018-03-13 DIAGNOSIS — I10 Essential (primary) hypertension: Secondary | ICD-10-CM | POA: Diagnosis not present

## 2018-03-20 ENCOUNTER — Ambulatory Visit (HOSPITAL_COMMUNITY)
Admission: RE | Admit: 2018-03-20 | Discharge: 2018-03-20 | Disposition: A | Discharge status: Home / Self Care | Payer: Medicare Other | Source: Ambulatory Visit | Attending: Family Medicine | Admitting: Family Medicine

## 2018-03-20 ENCOUNTER — Encounter (HOSPITAL_COMMUNITY): Payer: Self-pay

## 2018-03-20 DIAGNOSIS — Z1231 Encounter for screening mammogram for malignant neoplasm of breast: Secondary | ICD-10-CM

## 2018-03-27 DIAGNOSIS — Z23 Encounter for immunization: Secondary | ICD-10-CM | POA: Diagnosis not present

## 2018-07-07 DIAGNOSIS — E162 Hypoglycemia, unspecified: Secondary | ICD-10-CM | POA: Diagnosis not present

## 2018-07-07 DIAGNOSIS — Z1389 Encounter for screening for other disorder: Secondary | ICD-10-CM | POA: Diagnosis not present

## 2018-07-07 DIAGNOSIS — M5412 Radiculopathy, cervical region: Secondary | ICD-10-CM | POA: Diagnosis not present

## 2018-07-07 DIAGNOSIS — K219 Gastro-esophageal reflux disease without esophagitis: Secondary | ICD-10-CM | POA: Diagnosis not present

## 2018-07-07 DIAGNOSIS — R202 Paresthesia of skin: Secondary | ICD-10-CM | POA: Diagnosis not present

## 2018-07-07 DIAGNOSIS — Z0000 Encounter for general adult medical examination without abnormal findings: Secondary | ICD-10-CM | POA: Diagnosis not present

## 2018-07-07 DIAGNOSIS — G629 Polyneuropathy, unspecified: Secondary | ICD-10-CM | POA: Diagnosis not present

## 2018-07-07 DIAGNOSIS — I10 Essential (primary) hypertension: Secondary | ICD-10-CM | POA: Diagnosis not present

## 2018-07-11 ENCOUNTER — Other Ambulatory Visit (HOSPITAL_COMMUNITY): Payer: Self-pay | Admitting: Internal Medicine

## 2018-07-11 ENCOUNTER — Other Ambulatory Visit: Payer: Self-pay | Admitting: Internal Medicine

## 2018-07-11 DIAGNOSIS — G459 Transient cerebral ischemic attack, unspecified: Secondary | ICD-10-CM

## 2018-07-16 ENCOUNTER — Ambulatory Visit (HOSPITAL_COMMUNITY)
Admission: RE | Admit: 2018-07-16 | Discharge: 2018-07-16 | Disposition: A | Discharge status: Home / Self Care | Payer: Medicare Other | Source: Ambulatory Visit | Attending: Internal Medicine | Admitting: Internal Medicine

## 2018-07-16 DIAGNOSIS — G459 Transient cerebral ischemic attack, unspecified: Secondary | ICD-10-CM

## 2018-07-18 ENCOUNTER — Ambulatory Visit (HOSPITAL_COMMUNITY): Payer: Medicare Other

## 2018-07-18 ENCOUNTER — Ambulatory Visit (HOSPITAL_COMMUNITY): Admission: RE | Admit: 2018-07-18 | Payer: Medicare Other | Source: Ambulatory Visit

## 2018-09-10 DIAGNOSIS — D692 Other nonthrombocytopenic purpura: Secondary | ICD-10-CM | POA: Diagnosis not present

## 2018-09-10 DIAGNOSIS — I10 Essential (primary) hypertension: Secondary | ICD-10-CM | POA: Diagnosis not present

## 2018-09-10 DIAGNOSIS — R5383 Other fatigue: Secondary | ICD-10-CM | POA: Diagnosis not present

## 2018-09-10 DIAGNOSIS — R002 Palpitations: Secondary | ICD-10-CM | POA: Diagnosis not present

## 2018-09-10 DIAGNOSIS — K219 Gastro-esophageal reflux disease without esophagitis: Secondary | ICD-10-CM | POA: Diagnosis not present

## 2018-09-10 DIAGNOSIS — R109 Unspecified abdominal pain: Secondary | ICD-10-CM | POA: Diagnosis not present

## 2018-09-10 DIAGNOSIS — R0789 Other chest pain: Secondary | ICD-10-CM | POA: Diagnosis not present

## 2018-09-17 DIAGNOSIS — R140 Abdominal distension (gaseous): Secondary | ICD-10-CM | POA: Diagnosis not present

## 2018-09-17 DIAGNOSIS — T452X5A Adverse effect of vitamins, initial encounter: Secondary | ICD-10-CM | POA: Diagnosis not present

## 2018-09-23 ENCOUNTER — Encounter: Payer: Self-pay | Admitting: Internal Medicine

## 2018-09-23 ENCOUNTER — Telehealth: Payer: Self-pay | Admitting: Cardiology

## 2018-09-23 NOTE — Telephone Encounter (Signed)
Virtual Visit Pre-Appointment Phone Call  "(Name), I am calling you today to discuss your upcoming appointment. We are currently trying to limit exposure to the virus that causes COVID-19 by seeing patients at home rather than in the office."  1. "What is the BEST phone number to call the day of the visit?" - include this in appointment notes  2. Do you have or have access to (through a family member/friend) a smartphone with video capability that we can use for your visit?" a. If yes - list this number in appt notes as cell (if different from BEST phone #) and list the appointment type as a VIDEO visit in appointment notes b. If no - list the appointment type as a PHONE visit in appointment notes  3. Confirm consent - "In the setting of the current Covid19 crisis, you are scheduled for a (phone or video) visit with your provider on (date) at (time).  Just as we do with many in-office visits, in order for you to participate in this visit, we must obtain consent.  If you'd like, I can send this to your mychart (if signed up) or email for you to review.  Otherwise, I can obtain your verbal consent now.  All virtual visits are billed to your insurance company just like a normal visit would be.  By agreeing to a virtual visit, we'd like you to understand that the technology does not allow for your provider to perform an examination, and thus may limit your provider's ability to fully assess your condition. If your provider identifies any concerns that need to be evaluated in person, we will make arrangements to do so.  Finally, though the technology is pretty good, we cannot assure that it will always work on either your or our end, and in the setting of a video visit, we may have to convert it to a phone-only visit.  In either situation, we cannot ensure that we have a secure connection.  Are you willing to proceed?" STAFF: Did the patient verbally acknowledge consent to telehealth visit? Document  YES/NO here: Yes  4. Advise patient to be prepared - "Two hours prior to your appointment, go ahead and check your blood pressure, pulse, oxygen saturation, and your weight (if you have the equipment to check those) and write them all down. When your visit starts, your provider will ask you for this information. If you have an Apple Watch or Kardia device, please plan to have heart rate information ready on the day of your appointment. Please have a pen and paper handy nearby the day of the visit as well."  5. Give patient instructions for MyChart download to smartphone OR [email or web address] as below if video visit (depending on what platform provider is using)  6. Inform patient they will receive a phone call 15 minutes prior to their appointment time (may be from unknown caller ID) so they should be prepared to answer    TELEPHONE CALL NOTE  Mariah Ross has been deemed a candidate for a follow-up tele-health visit to limit community exposure during the Covid-19 pandemic. I spoke with the patient via phone to ensure availability of phone/video source, confirm preferred email & phone number, and discuss instructions and expectations.  I reminded Mariah Ross to be prepared with any vital sign and/or heart rhythm information that could potentially be obtained via home monitoring, at the time of her visit. I reminded Mariah Ross to expect a phone call prior to  her visit.  Terry L Goins 09/23/2018 2:06 PM

## 2018-09-25 ENCOUNTER — Encounter: Payer: Self-pay | Admitting: Cardiology

## 2018-09-25 ENCOUNTER — Telehealth (INDEPENDENT_AMBULATORY_CARE_PROVIDER_SITE_OTHER): Payer: Medicare Other | Admitting: Cardiology

## 2018-09-25 VITALS — BP 129/80 | HR 68 | Temp 97.7°F | Ht 65.0 in | Wt 188.0 lb

## 2018-09-25 DIAGNOSIS — R002 Palpitations: Secondary | ICD-10-CM

## 2018-09-25 DIAGNOSIS — I10 Essential (primary) hypertension: Secondary | ICD-10-CM

## 2018-09-25 NOTE — Progress Notes (Signed)
Virtual Visit via Telephone Note   This visit type was conducted due to national recommendations for restrictions regarding the COVID-19 Pandemic (e.g. social distancing) in an effort to limit this patient's exposure and mitigate transmission in our community.  Due to her co-morbid illnesses, this patient is at least at moderate risk for complications without adequate follow up.  This format is felt to be most appropriate for this patient at this time.  The patient did not have access to video technology/had technical difficulties with video requiring transitioning to audio format only (telephone).  All issues noted in this document were discussed and addressed.  No physical exam could be performed with this format.  Please refer to the patient's chart for her  consent to telehealth [facility].   Evaluation Performed:  Follow-up visit  Date:  09/25/2018   ID:  Mariah Ross, DOB 28-May-1946, MRN [id number]  Patient Location: Home Provider Location: Home  PCP:  Elfredia NevinsFusco, Lawrence, MD  Cardiologist:  Dr Dina RichJonathan Branch MD Electrophysiologist:  na  Chief Complaint:  Palpitations  History of Present Illness:    Mariah Ross is a 73 y.o. female seen as a new patient for the following medical problems. Over 3 years since her last visit.   1. Palpitations - episode 2 weeks ago. Felt cold with chills. First episode of quivering - no recurrent symptoms - had been drinking heavy caffeine, has cut back. Rare EtOH.    pcp EKG showed NSR   2. GERD - recent symptoms. Improved with recent med changes by pcp   3. HTN - since our last visit appears she has been started on dilt.     The patient does not have symptoms concerning for COVID-19 infection (fever, chills, cough, or new shortness of breath).    Past Medical History:  Diagnosis Date  . Hypertension    Past Surgical History:  Procedure Laterality Date  . SKIN BIOPSY    . TONSILLECTOMY    . TUBAL LIGATION        No outpatient medications have been marked as taking for the 09/25/18 encounter (Appointment) with Antoine PocheBranch, Jonathan F, MD.     Allergies:   Patient has no known allergies.   Social History   Tobacco Use  . Smoking status: Never Smoker  . Smokeless tobacco: Never Used  Substance Use Topics  . Alcohol use: Yes    Alcohol/week: 0.0 standard drinks    Comment: socially   . Drug use: No     Family Hx: The patient's family history includes Cancer in her father; Hypertension in her mother.  ROS:   Please see the history of present illness.    All other systems reviewed and are negative.   Prior CV studies:   The following studies were reviewed today:  10/2012 echo Study Conclusions  - Left ventricle: The cavity size was normal. Wall thickness was increased in a pattern of mild LVH. There was mild concentric hypertrophy. Systolic function was normal. The estimated ejection fraction was in the range of 55% to 60%. Wall motion was normal; there were no regional wall motion abnormalities. Doppler parameters are consistent with abnormal left ventricular relaxation (grade 1 diastolic dysfunction). - Aortic valve: Valve area: 2.13cm^2(VTI). Valve area: 2.4cm^2 (Vmax). - Mitral valve: Mild regurgitation. - Atrial septum: No defect or patent foramen ovale was identified.   Jan 2017 nuclear stress  There was no ST segment deviation noted during stress.  The study is normal.  This is  a low risk study.  The left ventricular ejection fraction is normal (55-65%).   Labs/Other Tests and Data Reviewed:    EKG:  na  Recent Labs: 10/01/2017: ALT 26; BUN 13; Creatinine, Ser 0.74; Hemoglobin 11.8; Platelets 193; Potassium 3.9; Sodium 141   Recent Lipid Panel No results found for: CHOL, TRIG, HDL, CHOLHDL, LDLCALC, LDLDIRECT  Wt Readings from Last 3 Encounters:  10/01/17 199 lb (90.3 kg)  11/12/16 198 lb (89.8 kg)  07/15/15 214 lb (97.1 kg)     Objective:     Vital Signs:  p 68 bp 129/80  Normal affect. Normal tone and speech pattern. Comfortable, no distress. No audible sounds of SOb or wheezing  ASSESSMENT & PLAN:    1. Palpitations - isolated episode. She has cut back on caffeine. Baseline EKG shows NSR - if significant recurrences would plan for cardiac monitor  2. HTN - at goal. Appears she has been changed to dilt since our last visit, continue at this time     COVID-19 Education: The signs and symptoms of COVID-19 were discussed with the patient and how to seek care for testing (follow up with PCP or arrange E-visit).  The importance of social distancing was discussed today.  Time:   Today, I have spent 22 minutes with the patient with telehealth technology discussing the above problems.     Medication Adjustments/Labs and Tests Ordered: Current medicines are reviewed at length with the patient today.  Concerns regarding medicines are outlined above.   Tests Ordered: No orders of the defined types were placed in this encounter.   Medication Changes: No orders of the defined types were placed in this encounter.   Disposition:  Follow up 6 months  Signed, Dina Rich, MD  09/25/2018 12:48 PM    [facility]

## 2018-09-25 NOTE — Progress Notes (Signed)

## 2018-09-26 ENCOUNTER — Other Ambulatory Visit: Payer: Self-pay | Admitting: Internal Medicine

## 2018-09-26 ENCOUNTER — Other Ambulatory Visit (HOSPITAL_COMMUNITY): Payer: Self-pay | Admitting: Internal Medicine

## 2018-09-26 DIAGNOSIS — R1319 Other dysphagia: Secondary | ICD-10-CM

## 2018-09-26 DIAGNOSIS — R1310 Dysphagia, unspecified: Secondary | ICD-10-CM

## 2018-10-01 ENCOUNTER — Telehealth: Payer: Self-pay | Admitting: Cardiology

## 2018-10-01 DIAGNOSIS — R002 Palpitations: Secondary | ICD-10-CM

## 2018-10-01 NOTE — Telephone Encounter (Signed)
2 week event monitor please to be mailed to her for palpitations   Dominga Ferry MD

## 2018-10-01 NOTE — Telephone Encounter (Signed)
Pt informed she will receive a call from [employer] to verify address, expect shipment in 3-5 days

## 2018-10-01 NOTE — Telephone Encounter (Signed)
Patient has televisit on 09/25/2018   States she has no caffeine in her body, her mouth is dry.     Do you want an event monitor for her ?

## 2018-10-01 NOTE — Telephone Encounter (Signed)
Patient called stating that her heart feels like it is fluttering. States that she was told to contact office if it happens again.  [phone].

## 2018-10-08 ENCOUNTER — Ambulatory Visit (INDEPENDENT_AMBULATORY_CARE_PROVIDER_SITE_OTHER): Payer: Medicare Other

## 2018-10-08 DIAGNOSIS — R002 Palpitations: Secondary | ICD-10-CM

## 2018-10-09 ENCOUNTER — Other Ambulatory Visit: Payer: Self-pay

## 2018-10-16 DIAGNOSIS — Z1389 Encounter for screening for other disorder: Secondary | ICD-10-CM | POA: Diagnosis not present

## 2018-10-16 DIAGNOSIS — E781 Pure hyperglyceridemia: Secondary | ICD-10-CM | POA: Diagnosis not present

## 2018-10-16 DIAGNOSIS — K219 Gastro-esophageal reflux disease without esophagitis: Secondary | ICD-10-CM | POA: Diagnosis not present

## 2018-10-16 DIAGNOSIS — I10 Essential (primary) hypertension: Secondary | ICD-10-CM | POA: Diagnosis not present

## 2018-10-16 DIAGNOSIS — Z0001 Encounter for general adult medical examination with abnormal findings: Secondary | ICD-10-CM | POA: Diagnosis not present

## 2018-10-23 ENCOUNTER — Telehealth: Payer: Self-pay | Admitting: Cardiology

## 2018-10-23 NOTE — Telephone Encounter (Signed)
Returned call to pt. Advised her that we will upload monitor to Dr. Wyline Mood to read, once he sends [place] the result we will route it to Dr. Phillips Odor for review. She just mailed it today, so I told her it may be a weeks or so before she hears from our office. She voiced understanding.

## 2018-10-23 NOTE — Telephone Encounter (Signed)
Requesting that monitor results be sent to Dr Phillips Odor

## 2018-10-29 ENCOUNTER — Encounter (INDEPENDENT_AMBULATORY_CARE_PROVIDER_SITE_OTHER): Payer: Self-pay | Admitting: *Deleted

## 2018-10-29 ENCOUNTER — Other Ambulatory Visit: Payer: Self-pay

## 2018-10-29 ENCOUNTER — Encounter (INDEPENDENT_AMBULATORY_CARE_PROVIDER_SITE_OTHER): Payer: Self-pay | Admitting: Internal Medicine

## 2018-10-29 ENCOUNTER — Other Ambulatory Visit (INDEPENDENT_AMBULATORY_CARE_PROVIDER_SITE_OTHER): Payer: Self-pay | Admitting: Internal Medicine

## 2018-10-29 ENCOUNTER — Ambulatory Visit (INDEPENDENT_AMBULATORY_CARE_PROVIDER_SITE_OTHER): Payer: Medicare Other | Admitting: Internal Medicine

## 2018-10-29 VITALS — BP 117/80 | HR 98 | Temp 98.9°F | Ht 64.0 in | Wt 178.7 lb

## 2018-10-29 DIAGNOSIS — K219 Gastro-esophageal reflux disease without esophagitis: Secondary | ICD-10-CM | POA: Diagnosis not present

## 2018-10-29 DIAGNOSIS — R1013 Epigastric pain: Secondary | ICD-10-CM

## 2018-10-29 MED ORDER — PANTOPRAZOLE SODIUM 40 MG PO TBEC: 40.0000 mg | DELAYED_RELEASE_TABLET | Freq: Every day | ORAL | 1 refills | Status: DC

## 2018-10-29 NOTE — Patient Instructions (Signed)
Rx for Protonix. US abdomen 

## 2018-10-29 NOTE — Progress Notes (Signed)
   Subjective:    Patient ID: Mariah Ross, female    DOB: 05/21/1946, 73 y.o.   MRN: [id number]  HPI Referred by Dr. Phillips Odor for bloating, frequent burping.  Started on Omeprazole in April of last year but stopped 3 weeks ago. She says she became constipated with the Omeprazole. She has been taking Gas X as needed. She says she feels better. She has fullness in her epigastric region. She says she has early satiety a time, but she will go ahead and eat. He appetite is okay. She doesn't crave food. Her stools are not like they use to be. Her stools are firm. When she wipes, there is no stool on the toilet tissue.  She denies any dysphagia.     09/11/2018 H. Pylori  negative.   Last colonoscopy in 2011 by Dr. Darrick Penna (average risk). Normal. Internal hemorrhoids. Review of Systems Past Medical History:  Diagnosis Date  . Hypertension     Past Surgical History:  Procedure Laterality Date  . SKIN BIOPSY    . TONSILLECTOMY    . TUBAL LIGATION      No Known Allergies  Current Outpatient Medications on File Prior to Visit  Medication Sig Dispense Refill  . diltiazem (TIAZAC) 180 MG 24 hr capsule Take 180 mg by mouth 2 (two) times a day.   11  . simethicone (MYLICON) 125 MG chewable tablet Chew 125 mg by mouth every 6 (six) hours as needed for flatulence.    . traMADol HCl 100 MG TABS Take 50 mg by mouth as needed.     No current facility-administered medications on file prior to visit.         Objective:   Physical Exam  Blood pressure 117/80, pulse 98, temperature 98.9 F (37.2 C), height 5\' 4"  (1.626 m), weight 178 lb 11.2 oz (81.1 kg).   Alert and oriented. Skin warm and dry. Oral mucosa is moist.   . Sclera anicteric, conjunctivae is pink. Thyroid not enlarged. No cervical lymphadenopathy. Lungs clear. Heart regular rate and rhythm.  Abdomen is soft. Bowel sounds are positive. No hepatomegaly. No abdominal masses felt. No tenderness.  No edema to lower extremities.        Assessment & Plan:  Bloating, Epigastric pain, GERD. Rx for Protonix.  US abdomen. OV in 4 weeks.

## 2018-10-30 ENCOUNTER — Telehealth: Payer: Self-pay

## 2018-10-30 NOTE — Telephone Encounter (Signed)
-----   Message from Jonathan F Branch, MD sent at 10/30/2018 10:41 AM EDT ----- Monitor overall looks good, no significant abnormal heart rhythms. If still having significant symptoms can start lopressor 25mg bid, if symptoms have improved can just monitor at this time. F/u 3 months   J Branch MD 

## 2018-10-30 NOTE — Telephone Encounter (Signed)
Called pt  No answer  Will try again later

## 2018-10-31 ENCOUNTER — Ambulatory Visit (HOSPITAL_COMMUNITY)
Admission: RE | Admit: 2018-10-31 | Discharge: 2018-10-31 | Disposition: A | Discharge status: Home / Self Care | Payer: Medicare Other | Source: Ambulatory Visit | Attending: Internal Medicine | Admitting: Internal Medicine

## 2018-10-31 ENCOUNTER — Telehealth: Payer: Self-pay

## 2018-10-31 ENCOUNTER — Other Ambulatory Visit: Payer: Self-pay

## 2018-10-31 DIAGNOSIS — R1013 Epigastric pain: Secondary | ICD-10-CM | POA: Diagnosis not present

## 2018-10-31 NOTE — Telephone Encounter (Signed)
-----   Message from Antoine Poche, MD sent at 10/30/2018 10:41 AM EDT ----- Monitor overall looks good, no significant abnormal heart rhythms. If still having significant symptoms can start lopressor 25mg  bid, if symptoms have improved can just monitor at this time. F/u 3 months   Dominga Ferry MD

## 2018-10-31 NOTE — Telephone Encounter (Signed)
Pt made aware of results. She stated that at this time she would rather wait to see if they continue or get worse. She will call and update [place] in the near future.

## 2018-11-03 ENCOUNTER — Other Ambulatory Visit (INDEPENDENT_AMBULATORY_CARE_PROVIDER_SITE_OTHER): Payer: Self-pay | Admitting: Internal Medicine

## 2018-11-03 ENCOUNTER — Telehealth (INDEPENDENT_AMBULATORY_CARE_PROVIDER_SITE_OTHER): Payer: Self-pay | Admitting: Internal Medicine

## 2018-11-03 DIAGNOSIS — K219 Gastro-esophageal reflux disease without esophagitis: Secondary | ICD-10-CM

## 2018-11-03 MED ORDER — FAMOTIDINE 20 MG PO TABS: 20.0000 mg | ORAL_TABLET | Freq: Every day | ORAL | 1 refills | Status: DC

## 2018-11-03 NOTE — Telephone Encounter (Signed)
Rx for Pepcid sent to her pharmacy 

## 2018-11-10 ENCOUNTER — Ambulatory Visit (HOSPITAL_COMMUNITY)
Admission: RE | Admit: 2018-11-10 | Discharge: 2018-11-10 | Disposition: A | Discharge status: Home / Self Care | Payer: Medicare Other | Source: Ambulatory Visit | Attending: Internal Medicine | Admitting: Internal Medicine

## 2018-11-10 ENCOUNTER — Other Ambulatory Visit: Payer: Self-pay

## 2018-11-10 DIAGNOSIS — R1310 Dysphagia, unspecified: Secondary | ICD-10-CM | POA: Diagnosis not present

## 2018-11-10 DIAGNOSIS — K449 Diaphragmatic hernia without obstruction or gangrene: Secondary | ICD-10-CM | POA: Diagnosis not present

## 2018-11-10 DIAGNOSIS — R1319 Other dysphagia: Secondary | ICD-10-CM

## 2018-11-26 ENCOUNTER — Ambulatory Visit (INDEPENDENT_AMBULATORY_CARE_PROVIDER_SITE_OTHER): Payer: Medicare Other | Admitting: Internal Medicine

## 2018-11-26 ENCOUNTER — Other Ambulatory Visit: Payer: Self-pay

## 2018-11-26 ENCOUNTER — Encounter (INDEPENDENT_AMBULATORY_CARE_PROVIDER_SITE_OTHER): Payer: Self-pay | Admitting: Internal Medicine

## 2018-11-26 VITALS — BP 120/78 | HR 83 | Temp 98.5°F | Ht 64.5 in | Wt 176.9 lb

## 2018-11-26 DIAGNOSIS — K219 Gastro-esophageal reflux disease without esophagitis: Secondary | ICD-10-CM

## 2018-11-26 NOTE — Progress Notes (Signed)
   Subjective:    Patient ID: Mariah Ross, female    DOB: [birth year], 73 y.o.   MRN: [id number]  HPI Here today for f/u. She was last seen in May of this year. Hx of GERD. Intolerant of Omeprazole (causes constipation). Had been taking Gas X as needed. She had some early satiety. Her appetite was okay. There was no dysphagia. Rx for Protonix given at [facility]. US abdomen was normal.  She also underwent an Esophagram in June which showed a small sliding hiatal hernia. She tells she is doing good. Her GERD maintained on Pepcid. She says she feels a lot better. Appetite is better. She has lost 1 pound since her last. BMs are better. Having a BM x 1 a day. No melena or BRRB.  She exercises by doing arm and leg lifts. She also walks around her house and in her yard.  She has a Programmer, multimedia garden.    Review of Systems Past Medical History:  Diagnosis Date  . Hypertension     Past Surgical History:  Procedure Laterality Date  . SKIN BIOPSY    . TONSILLECTOMY    . TUBAL LIGATION      No Known Allergies  Current Outpatient Medications on File Prior to Visit  Medication Sig Dispense Refill  . diltiazem (TIAZAC) 180 MG 24 hr capsule Take 180 mg by mouth 2 (two) times a day.   11  . famotidine (PEPCID) 20 MG tablet TAKE 1 TABLET(20 MG) BY MOUTH DAILY 90 tablet 3  . simethicone (MYLICON) 619 MG chewable tablet Chew 125 mg by mouth every 6 (six) hours as needed for flatulence.    . pantoprazole (PROTONIX) 40 MG tablet TAKE 1 TABLET(40 MG) BY MOUTH DAILY (Patient not taking: Reported on 11/26/2018) 90 tablet 3  . traMADol HCl 100 MG TABS Take 50 mg by mouth as needed.     No current facility-administered medications on file prior to visit.         Objective:   Physical Exam Blood pressure 120/78, pulse 83, temperature 98.5 F (36.9 C), height 5' 4.5" (1.638 m), weight 176 lb 14.4 oz (80.2 kg). Alert and oriented. Skin warm and dry. Oral mucosa is moist.   . Sclera anicteric, conjunctivae is pink.  Thyroid not enlarged. No cervical lymphadenopathy. Lungs clear. Heart regular rate and rhythm.  Abdomen is soft. Bowel sounds are positive. No hepatomegaly. No abdominal masses felt. No tenderness.  No edema to lower extremities. Patient is alert and oriented.         Assessment & Plan:  GERD.  Continue the Pepcid. She is doing well. OV in 1 year.

## 2019-01-22 ENCOUNTER — Encounter: Payer: Self-pay | Admitting: Cardiology

## 2019-01-22 ENCOUNTER — Telehealth (INDEPENDENT_AMBULATORY_CARE_PROVIDER_SITE_OTHER): Payer: Medicare Other | Admitting: Cardiology

## 2019-01-22 ENCOUNTER — Telehealth: Payer: Self-pay

## 2019-01-22 ENCOUNTER — Other Ambulatory Visit: Payer: Self-pay

## 2019-01-22 VITALS — BP 123/86 | HR 73 | Temp 98.0°F | Wt 176.0 lb

## 2019-01-22 DIAGNOSIS — I10 Essential (primary) hypertension: Secondary | ICD-10-CM

## 2019-01-22 DIAGNOSIS — R002 Palpitations: Secondary | ICD-10-CM | POA: Diagnosis not present

## 2019-01-22 NOTE — Telephone Encounter (Signed)

## 2019-01-22 NOTE — Patient Instructions (Signed)
Medication Instructions:  DECREASE DILTIAZEM TO 180 MG ONCE DAILY   Labwork: NONE  Testing/Procedures: NONE  Follow-Up: Your physician wants you to follow-up in: 1 YEAR.  You will receive a reminder letter in the mail two months in advance. If you don't receive a letter, please call our office to schedule the follow-up appointment.   Any Other Special Instructions Will Be Listed Below (If Applicable).     If you need a refill on your cardiac medications before your next appointment, please call your pharmacy.

## 2019-01-22 NOTE — Progress Notes (Signed)
Virtual Visit via Telephone Note   This visit type was conducted due to national recommendations for restrictions regarding the COVID-19 Pandemic (e.g. social distancing) in an effort to limit this patient's exposure and mitigate transmission in our community.  Due to her co-morbid illnesses, this patient is at least at moderate risk for complications without adequate follow up.  This format is felt to be most appropriate for this patient at this time.  The patient did not have access to video technology/had technical difficulties with video requiring transitioning to audio format only (telephone).  All issues noted in this document were discussed and addressed.  No physical exam could be performed with this format.  Please refer to the patient's chart for her  consent to telehealth for [facility].   Date:  01/22/2019   ID:  Mariah Ross, DOB 1946/02/08, MRN [id number]  Patient Location: Home Provider Location: Office  PCP:  Elfredia NevinsFusco, Lawrence, MD  Cardiologist:  Dr Wyline MoodBranch Electrophysiologist:  None   Evaluation Performed:  Follow-Up Visit  Chief Complaint:  Follow up  History of Present Illness:    Mariah JewelHazel L Bamba is a 73 y.o. female seen today for follow up of the following medical problems.    1. Palpitations - episode 2 weeks ago. Felt cold with chills. First episode of quivering - no recurrent symptoms - had been drinking heavy caffeine, has cut back. Rare EtOH.    10/2018 monitor: No significant arrhythmias - no recent symptoms. Has weaned her caffeine.    2. GERD - followed by pcp   3. HTN - noticing some low bp's in the evenings, down to the 90s at times. She will skip her      The patient does not have symptoms concerning for COVID-19 infection (fever, chills, cough, or new shortness of breath).    Past Medical History:  Diagnosis Date  . Hypertension    Past Surgical History:  Procedure Laterality Date  . SKIN BIOPSY    . TONSILLECTOMY     . TUBAL LIGATION       Current Meds  Medication Sig  . diltiazem (TIAZAC) 180 MG 24 hr capsule Take 180 mg by mouth 2 (two) times a day.   . famotidine (PEPCID) 20 MG tablet TAKE 1 TABLET(20 MG) BY MOUTH DAILY  . [DISCONTINUED] pantoprazole (PROTONIX) 40 MG tablet TAKE 1 TABLET(40 MG) BY MOUTH DAILY  . [DISCONTINUED] simethicone (MYLICON) 125 MG chewable tablet Chew 125 mg by mouth every 6 (six) hours as needed for flatulence.  . [DISCONTINUED] traMADol HCl 100 MG TABS Take 50 mg by mouth as needed.     Allergies:   Patient has no known allergies.   Social History   Tobacco Use  . Smoking status: Never Smoker  . Smokeless tobacco: Never Used  Substance Use Topics  . Alcohol use: Yes    Alcohol/week: 0.0 standard drinks    Comment: socially   . Drug use: No     Family Hx: The patient's family history includes Cancer in her father; Hypertension in her mother.  ROS:   Please see the history of present illness.     All other systems reviewed and are negative.   Prior CV studies:   The following studies were reviewed today:  Labs/Other Tests and Data Reviewed:    EKG:  No ECG reviewed.  Recent Labs: No results found for requested labs within last 8760 hours.   Recent Lipid Panel No results found for:  CHOL, TRIG, HDL, CHOLHDL, LDLCALC, LDLDIRECT  Wt Readings from Last 3 Encounters:  01/22/19 176 lb (79.8 kg)  11/26/18 176 lb 14.4 oz (80.2 kg)  10/29/18 178 lb 11.2 oz (81.1 kg)     Objective:    Vital Signs:  BP 123/86   Pulse 73   Temp 98 F (36.7 C)   Wt 176 lb (79.8 kg)   BMI 29.74 kg/m    Normal affect. Normal speech pattern and tone. Comfortable, no apparent distress. No audibel signs of SOB or wheezing.   ASSESSMENT & PLAN:    1. Palpitations - sypmtoms have improved with weaning of caffeine, recent benign monitor - continue to monitor at this time  2. HTN - significant weight loss, I think her pior bp regimen is too much for her now, getting  low bp's in the evenings - she will take dilt just 180mg  once daily and monitor bp's   F/u 1 year  COVID-19 Education: The signs and symptoms of COVID-19 were discussed with the patient and how to seek care for testing (follow up with PCP or arrange E-visit).  The importance of social distancing was discussed today.  Time:   Today, I have spent 14 minutes with the patient with telehealth technology discussing the above problems.     Medication Adjustments/Labs and Tests Ordered: Current medicines are reviewed at length with the patient today.  Concerns regarding medicines are outlined above.   Tests Ordered: No orders of the defined types were placed in this encounter.   Medication Changes: No orders of the defined types were placed in this encounter.   Follow Up:  In Person in 1 year(s)  Signed, Carlyle Dolly, MD  01/22/2019 1:55 PM    [place]

## 2019-03-10 DIAGNOSIS — Z23 Encounter for immunization: Secondary | ICD-10-CM | POA: Diagnosis not present

## 2019-03-10 DIAGNOSIS — E7849 Other hyperlipidemia: Secondary | ICD-10-CM | POA: Diagnosis not present

## 2019-03-10 DIAGNOSIS — I10 Essential (primary) hypertension: Secondary | ICD-10-CM | POA: Diagnosis not present

## 2019-04-02 ENCOUNTER — Other Ambulatory Visit (HOSPITAL_COMMUNITY): Payer: Self-pay | Admitting: Family Medicine

## 2019-04-02 DIAGNOSIS — Z1231 Encounter for screening mammogram for malignant neoplasm of breast: Secondary | ICD-10-CM

## 2019-04-06 ENCOUNTER — Ambulatory Visit (HOSPITAL_COMMUNITY)
Admission: RE | Admit: 2019-04-06 | Discharge: 2019-04-06 | Disposition: A | Discharge status: Home / Self Care | Payer: Medicare Other | Source: Ambulatory Visit | Attending: Family Medicine | Admitting: Family Medicine

## 2019-04-06 ENCOUNTER — Other Ambulatory Visit: Payer: Self-pay

## 2019-04-06 DIAGNOSIS — Z1231 Encounter for screening mammogram for malignant neoplasm of breast: Secondary | ICD-10-CM | POA: Insufficient documentation

## 2019-04-24 DIAGNOSIS — R300 Dysuria: Secondary | ICD-10-CM | POA: Diagnosis not present

## 2019-04-24 DIAGNOSIS — N952 Postmenopausal atrophic vaginitis: Secondary | ICD-10-CM | POA: Diagnosis not present

## 2019-06-22 DIAGNOSIS — I209 Angina pectoris, unspecified: Secondary | ICD-10-CM | POA: Diagnosis not present

## 2019-06-22 DIAGNOSIS — K219 Gastro-esophageal reflux disease without esophagitis: Secondary | ICD-10-CM | POA: Diagnosis not present

## 2019-06-22 DIAGNOSIS — M1388 Other specified arthritis, other site: Secondary | ICD-10-CM | POA: Diagnosis not present

## 2019-06-22 DIAGNOSIS — Z1389 Encounter for screening for other disorder: Secondary | ICD-10-CM | POA: Diagnosis not present

## 2019-06-25 ENCOUNTER — Other Ambulatory Visit (HOSPITAL_COMMUNITY): Payer: Self-pay | Admitting: Internal Medicine

## 2019-06-25 DIAGNOSIS — Z1389 Encounter for screening for other disorder: Secondary | ICD-10-CM

## 2019-07-09 DIAGNOSIS — H6590 Unspecified nonsuppurative otitis media, unspecified ear: Secondary | ICD-10-CM | POA: Diagnosis not present

## 2019-08-12 DIAGNOSIS — M545 Low back pain: Secondary | ICD-10-CM | POA: Diagnosis not present

## 2019-08-12 DIAGNOSIS — I209 Angina pectoris, unspecified: Secondary | ICD-10-CM | POA: Diagnosis not present

## 2019-08-12 DIAGNOSIS — I10 Essential (primary) hypertension: Secondary | ICD-10-CM | POA: Diagnosis not present

## 2019-08-12 DIAGNOSIS — T50905A Adverse effect of unspecified drugs, medicaments and biological substances, initial encounter: Secondary | ICD-10-CM | POA: Diagnosis not present

## 2019-08-21 ENCOUNTER — Ambulatory Visit: Payer: Medicare Other

## 2019-08-21 ENCOUNTER — Ambulatory Visit: Payer: Medicare Other | Attending: Internal Medicine

## 2019-08-21 ENCOUNTER — Other Ambulatory Visit: Payer: Medicare Other

## 2019-08-21 DIAGNOSIS — Z23 Encounter for immunization: Secondary | ICD-10-CM

## 2019-08-21 NOTE — Progress Notes (Signed)
   Covid-19 Vaccination Clinic  Name:  MAIKA MCELVEEN    MRN: [id number] DOB: January 09, 1946  08/21/2019  Ms. Stallings was observed post Covid-19 immunization for 15 minutes without incident. She was provided with Vaccine Information Sheet and instruction to access the V-Safe system.   Ms. Wentzel was instructed to call 911 with any severe reactions post vaccine: [name] Difficulty breathing  . Swelling of face and throat  . A fast heartbeat  . A bad rash all over body  . Dizziness and weakness   Immunizations Administered    Name Date Dose VIS Date Route   Moderna COVID-19 Vaccine 08/21/2019 10:24 AM 0.5 mL 05/05/2019 Intramuscular   Manufacturer: Moderna   Lot: [id number]   NDC: 61950-932-67

## 2019-08-24 DIAGNOSIS — K529 Noninfective gastroenteritis and colitis, unspecified: Secondary | ICD-10-CM | POA: Diagnosis not present

## 2019-09-10 DIAGNOSIS — H6523 Chronic serous otitis media, bilateral: Secondary | ICD-10-CM | POA: Diagnosis not present

## 2019-09-10 DIAGNOSIS — J302 Other seasonal allergic rhinitis: Secondary | ICD-10-CM | POA: Diagnosis not present

## 2019-09-22 ENCOUNTER — Ambulatory Visit: Payer: Medicare Other | Attending: Internal Medicine

## 2019-09-22 DIAGNOSIS — Z23 Encounter for immunization: Secondary | ICD-10-CM

## 2019-09-22 NOTE — Progress Notes (Signed)
   Covid-19 Vaccination Clinic  Name:  Mariah Ross    MRN: [id number] DOB: August 22, 1945  09/22/2019  Mariah Ross was observed post Covid-19 immunization for 30 minutes based on pre-vaccination screening without incident. She was provided with Vaccine Information Sheet and instruction to access the V-Safe system.   Mariah Ross was instructed to call 911 with any severe reactions post vaccine: [name] Difficulty breathing  . Swelling of face and throat  . A fast heartbeat  . A bad rash all over body  . Dizziness and weakness   Immunizations Administered    Name Date Dose VIS Date Route   Moderna COVID-19 Vaccine 09/22/2019  9:31 AM 0.5 mL 05/2019 Intramuscular   Manufacturer: Moderna   Lot: [id number]   NDC: 33007-622-63

## 2019-11-09 DIAGNOSIS — J301 Allergic rhinitis due to pollen: Secondary | ICD-10-CM | POA: Diagnosis not present

## 2019-11-09 DIAGNOSIS — J019 Acute sinusitis, unspecified: Secondary | ICD-10-CM | POA: Diagnosis not present

## 2019-11-16 DIAGNOSIS — Z1389 Encounter for screening for other disorder: Secondary | ICD-10-CM | POA: Diagnosis not present

## 2019-11-16 DIAGNOSIS — R7309 Other abnormal glucose: Secondary | ICD-10-CM | POA: Diagnosis not present

## 2019-11-16 DIAGNOSIS — J302 Other seasonal allergic rhinitis: Secondary | ICD-10-CM | POA: Diagnosis not present

## 2019-11-16 DIAGNOSIS — Z0001 Encounter for general adult medical examination with abnormal findings: Secondary | ICD-10-CM | POA: Diagnosis not present

## 2019-11-16 DIAGNOSIS — I10 Essential (primary) hypertension: Secondary | ICD-10-CM | POA: Diagnosis not present

## 2019-12-01 ENCOUNTER — Ambulatory Visit (INDEPENDENT_AMBULATORY_CARE_PROVIDER_SITE_OTHER): Payer: Medicare Other | Admitting: Internal Medicine

## 2019-12-08 ENCOUNTER — Other Ambulatory Visit: Payer: Self-pay

## 2019-12-08 ENCOUNTER — Ambulatory Visit (INDEPENDENT_AMBULATORY_CARE_PROVIDER_SITE_OTHER): Payer: Medicare Other | Admitting: Gastroenterology

## 2019-12-08 ENCOUNTER — Encounter (INDEPENDENT_AMBULATORY_CARE_PROVIDER_SITE_OTHER): Payer: Self-pay | Admitting: Gastroenterology

## 2019-12-08 VITALS — BP 137/84 | HR 66 | Temp 99.1°F | Ht 63.0 in | Wt 180.1 lb

## 2019-12-08 DIAGNOSIS — Z1211 Encounter for screening for malignant neoplasm of colon: Secondary | ICD-10-CM | POA: Diagnosis not present

## 2019-12-08 DIAGNOSIS — K219 Gastro-esophageal reflux disease without esophagitis: Secondary | ICD-10-CM

## 2019-12-08 MED ORDER — FAMOTIDINE 20 MG PO TABS: 20.0000 mg | ORAL_TABLET | Freq: Every day | ORAL | 11 refills | Status: DC

## 2019-12-08 NOTE — Patient Instructions (Signed)
Please call if you develop any issues with abdominal pain, rectal bleeding or change in bowel habits and would schedule colonoscopy.   GERD instructions: -Please avoid lying flat within 2 to 3 hours of eating, this will make reflux symptoms worse. -Some patients find elevating the head of the bed beneficial. -Avoid spicy greasy foods as well as caffeine, coffee, sodas-these food/drinks can worsen heartburn and reflux. -Tobacco will worsen reflux, please try to decrease/eliminate tobacco intake if applicable. -Avoid NSAID products (ibuprofen, aspirin, Advil, Aleve, Goody's, BCs, Alka-Seltzer) - if needing these occasionally please try to take with meal or snack to decrease stomach irritation. -If taking medication for reflux such as prilosec, nexium, aciphex, dexilant, prevacid - take 20-30 minutes before a meal for maximum effectiveness.

## 2019-12-08 NOTE — Progress Notes (Signed)
Patient profile: Mariah Ross is a 74 y.o. female seen for f/up.  Last seen June 2020  History of Present Illness: Mariah Ross is seen today for f/up of GERD. She is currently maintained on pepcid 20 mg once a day. Symptoms doing well on current therapy. Denies esophageal burning or regurgitation but has some fullness in epigastric area at times, this actually improves w/ eating and occurs if she goes long periods between meals. After she eats she feels well. Denies any dysphagia. No nausea/vomiting   She reports since stopping diltiazem bowels are much more regularly. Previously had mild constipation. Now having a daily BM.  She denies any rectal bleeding or melena.  No lower GI complaints.   Wt Readings from Last 3 Encounters:  12/08/19 180 lb 1.6 oz (81.7 kg)  01/22/19 176 lb (79.8 kg)  11/26/18 176 lb 14.4 oz (80.2 kg)     Last Colonoscopy: 2011-Dr Fields-normal.     Past Medical History:  Past Medical History:  Diagnosis Date  . Hypertension     Problem List: Patient Active Problem List   Diagnosis Date Noted  . Essential hypertension 07/15/2015    Past Surgical History: Past Surgical History:  Procedure Laterality Date  . SKIN BIOPSY    . TONSILLECTOMY    . TUBAL LIGATION      Allergies: No Known Allergies    Home Medications:  Current Outpatient Medications:  .  amLODipine (NORVASC) 2.5 MG tablet, Take 2.5 mg by mouth daily., Disp: , Rfl:  .  famotidine (PEPCID) 20 MG tablet, TAKE 1 TABLET(20 MG) BY MOUTH DAILY, Disp: 90 tablet, Rfl: 3 .  diltiazem (TIAZAC) 180 MG 24 hr capsule, Take 180 mg by mouth daily. (Patient not taking: Reported on 12/08/2019), Disp: , Rfl: 11   Family History: family history includes Cancer in her father; Hypertension in her mother.    Social History:   reports that she has never smoked. She has never used smokeless tobacco. She reports current alcohol use. She reports that she does not use drugs.   Review of  Systems: Constitutional: Denies weight loss/weight gain  Eyes: No changes in vision. ENT: No oral lesions, sore throat.  GI: see HPI.  Heme/Lymph: No easy bruising.  CV: No chest pain.  GU: No hematuria.  Integumentary: No rashes.  Neuro: No headaches.  Psych: No depression/anxiety.  Endocrine: No heat/cold intolerance.  Allergic/Immunologic: No urticaria.  Resp: No cough, SOB.  Musculoskeletal: No joint swelling.    Physical Examination: BP 137/84 (BP Location: Left Arm, Patient Position: Sitting)   Pulse 66   Temp 99.1 F (37.3 C) (Oral)   Ht 5\' 3"  (1.6 m)   Wt 180 lb 1.6 oz (81.7 kg)   BMI 31.90 kg/m  Gen: NAD, alert and oriented x 4 HEENT: PEERLA, EOMI, Neck: supple, no JVD Chest: CTA bilaterally, no wheezes, crackles, or other adventitious sounds CV: RRR, no m/g/c/r Abd: soft, NT, ND, +BS in all four quadrants; no HSM, guarding, ridigity, or rebound tenderness Ext: no edema, well perfused with 2+ pulses, Skin: no rash or lesions noted on observed skin Lymph: no noted LAD  Data Reviewed:   Per patient PCP is monitors labs and these have been unremarkable.    Assessment/Plan: Ms. Groseclose is a 74 y.o. female   1. Crc screening - given patient's age and she is feeling well she would like to hold off on colonoscopy. Son had unknown type of polyp but no other family history colon  polyp/colon cancer.  She will look for any lower GI symptoms and contact me if develops.  2. GERD-doing well on pepcid.  She will continue and we discussed diet modifications.  She has no upper GI alarm symptoms  .  Follow-up 1 year-sooner if needed     Rebbecca was seen today for 1 year follow up.  Diagnoses and all orders for this visit:  Chronic GERD  Colon cancer screening     Recommendations:   I personally performed the service, non-incident to. (WP)  Tawni Pummel, [facility]

## 2019-12-14 DIAGNOSIS — K219 Gastro-esophageal reflux disease without esophagitis: Secondary | ICD-10-CM | POA: Diagnosis not present

## 2019-12-14 DIAGNOSIS — I10 Essential (primary) hypertension: Secondary | ICD-10-CM | POA: Diagnosis not present

## 2019-12-14 DIAGNOSIS — L255 Unspecified contact dermatitis due to plants, except food: Secondary | ICD-10-CM | POA: Diagnosis not present

## 2019-12-16 ENCOUNTER — Telehealth: Payer: Self-pay | Admitting: Cardiology

## 2019-12-16 NOTE — Telephone Encounter (Signed)
  Patient Consent for Virtual Visit         [name] has provided verbal consent on 12/16/2019 for a virtual visit (video or telephone).   CONSENT FOR VIRTUAL VISIT FOR:  Mariah Ross  By participating in this virtual visit I agree to the following:  I hereby voluntarily request, consent and authorize [facility] and its employed or contracted physicians, physician assistants, nurse practitioners or other licensed health care professionals (the Practitioner), to provide me with telemedicine health care services (the "Services") as deemed necessary by the treating Practitioner. I acknowledge and consent to receive the Services by the Practitioner via telemedicine. I understand that the telemedicine visit will involve communicating with the Practitioner through live audiovisual communication technology and the disclosure of certain medical information by electronic transmission. I acknowledge that I have been given the opportunity to request an in-person assessment or other available alternative prior to the telemedicine visit and am voluntarily participating in the telemedicine visit.  I understand that I have the right to withhold or withdraw my consent to the use of telemedicine in the course of my care at any time, without affecting my right to future care or treatment, and that the Practitioner or I may terminate the telemedicine visit at any time. I understand that I have the right to inspect all information obtained and/or recorded in the course of the telemedicine visit and may receive copies of available information for a reasonable fee.  I understand that some of the potential risks of receiving the Services via telemedicine include:  Mariah Ross Delay or interruption in medical evaluation due to technological equipment failure or disruption; . Information transmitted may not be sufficient (e.g. poor resolution of images) to allow for appropriate medical decision making by the Practitioner;  and/or  . In rare instances, security protocols could fail, causing a breach of personal health information.  Furthermore, I acknowledge that it is my responsibility to provide information about my medical history, conditions and care that is complete and accurate to the best of my ability. I acknowledge that Practitioner's advice, recommendations, and/or decision may be based on factors not within their control, such as incomplete or inaccurate data provided by me or distortions of diagnostic images or specimens that may result from electronic transmissions. I understand that the practice of medicine is not an exact science and that Practitioner makes no warranties or guarantees regarding treatment outcomes. I acknowledge that a copy of this consent can be made available to me via my patient portal [facility] MyChart), or I can request a printed copy by calling the office of [facility].    I understand that my insurance will be billed for this visit.   I have read or had this consent read to me. . I understand the contents of this consent, which adequately explains the benefits and risks of the Services being provided via telemedicine.  . I have been provided ample opportunity to ask questions regarding this consent and the Services and have had my questions answered to my satisfaction. . I give my informed consent for the services to be provided through the use of telemedicine in my medical care

## 2019-12-23 ENCOUNTER — Telehealth (INDEPENDENT_AMBULATORY_CARE_PROVIDER_SITE_OTHER): Payer: Medicare Other | Admitting: Cardiology

## 2019-12-23 ENCOUNTER — Encounter: Payer: Self-pay | Admitting: Cardiology

## 2019-12-23 ENCOUNTER — Encounter: Payer: Self-pay | Admitting: *Deleted

## 2019-12-23 ENCOUNTER — Other Ambulatory Visit: Payer: Self-pay

## 2019-12-23 VITALS — BP 138/88 | HR 58 | Temp 97.6°F | Wt 180.0 lb

## 2019-12-23 DIAGNOSIS — R002 Palpitations: Secondary | ICD-10-CM

## 2019-12-23 DIAGNOSIS — I10 Essential (primary) hypertension: Secondary | ICD-10-CM

## 2019-12-23 NOTE — Patient Instructions (Signed)
Medication Instructions:  Your physician recommends that you continue on your current medications as directed. Please refer to the Current Medication list given to you today.  *If you need a refill on your cardiac medications before your next appointment, please call your pharmacy*   Lab Work: NONE   If you have labs (blood work) drawn today and your tests are completely normal, you will receive your results only by: . MyChart Message (if you have MyChart) OR . A paper copy in the mail If you have any lab test that is abnormal or we need to change your treatment, we will call you to review the results.   Testing/Procedures: NONE    Follow-Up: At [facility], you and your health needs are our priority.  As part of our continuing mission to provide you with exceptional heart care, we have created designated Provider Care Teams.  These Care Teams include your primary Cardiologist (physician) and Advanced Practice Providers (APPs -  Physician Assistants and Nurse Practitioners) who all work together to provide you with the care you need, when you need it.  We recommend signing up for the patient portal called "MyChart".  Sign up information is provided on this After Visit Summary.  MyChart is used to connect with patients for Virtual Visits (Telemedicine).  Patients are able to view lab/test results, encounter notes, upcoming appointments, etc.  Non-urgent messages can be sent to your provider as well.   To learn more about what you can do with MyChart, go to [email or web address].com.    Your next appointment:   1 year(s)  The format for your next appointment:   In Person  Provider:   Jonathan Branch, MD   Other Instructions Thank you for choosing [facility]!    

## 2019-12-23 NOTE — Progress Notes (Signed)
Virtual Visit via Telephone Note   This visit type was conducted due to national recommendations for restrictions regarding the COVID-19 Pandemic (e.g. social distancing) in an effort to limit this patient's exposure and mitigate transmission in our community.  Due to her co-morbid illnesses, this patient is at least at moderate risk for complications without adequate follow up.  This format is felt to be most appropriate for this patient at this time.  The patient did not have access to video technology/had technical difficulties with video requiring transitioning to audio format only (telephone).  All issues noted in this document were discussed and addressed.  No physical exam could be performed with this format.  Please refer to the patient's chart for her  consent to telehealth for [facility].   The patient was identified using 2 identifiers.  Date:  12/23/2019   ID:  Mariah Ross, DOB Apr 30, 1946, MRN [id number]  Patient Location: Home Provider Location: Office/Clinic  PCP:  Elfredia Nevins, MD  Cardiologist:  Dr Wyline Mood Electrophysiologist:  None   Evaluation Performed:  Follow-Up Visit  Chief Complaint:  Follow up  History of Present Illness:    Mariah Ross is a 74 y.o. female female seen today for follow up of the following medical problems.    1. Palpitations - episode 2 weeks ago. Felt cold with chills. First episode of quivering - no recurrent symptoms - had been drinking heavy caffeine, has cut back. Rare EtOH.   10/2018 monitor: No significant arrhythmias - no recent palpitations.    2. GERD - followed by pcp   3. HTN - in general bp's much improved with weight loss - she is off diltiazem due to low bp's, now on low dose norvasc but is not taking.  - home bp's 130s/60s  SH: she has had covid vaccine.   The patient does not have symptoms concerning for COVID-19 infection (fever, chills, cough, or new shortness of breath).    Past  Medical History:  Diagnosis Date  . Hypertension    Past Surgical History:  Procedure Laterality Date  . SKIN BIOPSY    . TONSILLECTOMY    . TUBAL LIGATION       No outpatient medications have been marked as taking for the 12/23/19 encounter (Appointment) with Antoine Poche, MD.     Allergies:   Patient has no known allergies.   Social History   Tobacco Use  . Smoking status: Never Smoker  . Smokeless tobacco: Never Used  Vaping Use  . Vaping Use: Never used  Substance Use Topics  . Alcohol use: Yes    Alcohol/week: 0.0 standard drinks    Comment: socially   . Drug use: No     Family Hx: The patient's family history includes Cancer in her father; Hypertension in her mother.  ROS:   Please see the history of present illness.    All other systems reviewed and are negative.   Prior CV studies:   The following studies were reviewed today:    Labs/Other Tests and Data Reviewed:    EKG:  No ECG reviewed.  Recent Labs: No results found for requested labs within last 8760 hours.   Recent Lipid Panel No results found for: CHOL, TRIG, HDL, CHOLHDL, LDLCALC, LDLDIRECT  Wt Readings from Last 3 Encounters:  12/08/19 180 lb 1.6 oz (81.7 kg)  01/22/19 176 lb (79.8 kg)  11/26/18 176 lb 14.4 oz (80.2 kg)     Objective:    Vital Signs:  Today's Vitals   12/23/19 0807  BP: 138/88  Pulse: (!) 58  Temp: 97.6 F (36.4 C)  Weight: 180 lb (81.6 kg)   Body mass index is 31.89 kg/m. Normal affect. Normal speech pattern and tone. COmfortable, no apparent distress. No audible signs of sob or wheezing.   ASSESSMENT & PLAN:    1. Palpitations - sypmtoms have improved with weaning of caffeine, recent benign monitor - no recent issues even off diltiazem, continue to monitor  2. HTN - bp's improved with weight loss, no longer requiring medical therapy - conitnue to monitor   F/u 1 year   COVID-19 Education: The signs and symptoms of COVID-19 were  discussed with the patient and how to seek care for testing (follow up with PCP or arrange E-visit).  The importance of social distancing was discussed today.  Time:   Today, I have spent 13 minutes with the patient with telehealth technology discussing the above problems.     Medication Adjustments/Labs and Tests Ordered: Current medicines are reviewed at length with the patient today.  Concerns regarding medicines are outlined above.   Tests Ordered: No orders of the defined types were placed in this encounter.   Medication Changes: No orders of the defined types were placed in this encounter.   Follow Up:  In Person in 1 year(s)  Signed, Dina Rich, MD  12/23/2019 7:46 AM    [facility]

## 2020-02-15 ENCOUNTER — Other Ambulatory Visit (HOSPITAL_COMMUNITY): Payer: Self-pay | Admitting: Internal Medicine

## 2020-02-15 DIAGNOSIS — M7661 Achilles tendinitis, right leg: Secondary | ICD-10-CM | POA: Diagnosis not present

## 2020-02-15 DIAGNOSIS — E2839 Other primary ovarian failure: Secondary | ICD-10-CM

## 2020-02-15 DIAGNOSIS — K219 Gastro-esophageal reflux disease without esophagitis: Secondary | ICD-10-CM | POA: Diagnosis not present

## 2020-02-15 DIAGNOSIS — I872 Venous insufficiency (chronic) (peripheral): Secondary | ICD-10-CM | POA: Diagnosis not present

## 2020-02-15 DIAGNOSIS — I8393 Asymptomatic varicose veins of bilateral lower extremities: Secondary | ICD-10-CM | POA: Diagnosis not present

## 2020-02-15 DIAGNOSIS — I10 Essential (primary) hypertension: Secondary | ICD-10-CM | POA: Diagnosis not present

## 2020-02-24 ENCOUNTER — Ambulatory Visit (HOSPITAL_COMMUNITY)
Admission: RE | Admit: 2020-02-24 | Discharge: 2020-02-24 | Disposition: A | Discharge status: Home / Self Care | Payer: Medicare Other | Source: Ambulatory Visit | Attending: Internal Medicine | Admitting: Internal Medicine

## 2020-02-24 ENCOUNTER — Other Ambulatory Visit: Payer: Self-pay

## 2020-02-24 DIAGNOSIS — E2839 Other primary ovarian failure: Secondary | ICD-10-CM | POA: Diagnosis present

## 2020-02-24 DIAGNOSIS — Z780 Asymptomatic menopausal state: Secondary | ICD-10-CM | POA: Diagnosis not present

## 2020-02-24 DIAGNOSIS — R29890 Loss of height: Secondary | ICD-10-CM | POA: Diagnosis not present

## 2020-02-24 DIAGNOSIS — Z13820 Encounter for screening for osteoporosis: Secondary | ICD-10-CM | POA: Diagnosis not present

## 2020-04-14 ENCOUNTER — Other Ambulatory Visit (HOSPITAL_COMMUNITY): Payer: Self-pay | Admitting: Internal Medicine

## 2020-04-14 DIAGNOSIS — H6991 Unspecified Eustachian tube disorder, right ear: Secondary | ICD-10-CM | POA: Diagnosis not present

## 2020-04-14 DIAGNOSIS — H6591 Unspecified nonsuppurative otitis media, right ear: Secondary | ICD-10-CM | POA: Diagnosis not present

## 2020-04-14 DIAGNOSIS — J309 Allergic rhinitis, unspecified: Secondary | ICD-10-CM | POA: Diagnosis not present

## 2020-04-14 DIAGNOSIS — K219 Gastro-esophageal reflux disease without esophagitis: Secondary | ICD-10-CM | POA: Diagnosis not present

## 2020-04-14 DIAGNOSIS — Z1231 Encounter for screening mammogram for malignant neoplasm of breast: Secondary | ICD-10-CM

## 2020-04-14 DIAGNOSIS — H8113 Benign paroxysmal vertigo, bilateral: Secondary | ICD-10-CM | POA: Diagnosis not present

## 2020-04-15 DIAGNOSIS — Z79899 Other long term (current) drug therapy: Secondary | ICD-10-CM | POA: Diagnosis not present

## 2020-04-15 DIAGNOSIS — E7849 Other hyperlipidemia: Secondary | ICD-10-CM | POA: Diagnosis not present

## 2020-05-06 DIAGNOSIS — J302 Other seasonal allergic rhinitis: Secondary | ICD-10-CM | POA: Diagnosis not present

## 2020-05-06 DIAGNOSIS — H6593 Unspecified nonsuppurative otitis media, bilateral: Secondary | ICD-10-CM | POA: Diagnosis not present

## 2020-06-27 ENCOUNTER — Other Ambulatory Visit: Payer: Self-pay

## 2020-06-27 ENCOUNTER — Ambulatory Visit (HOSPITAL_COMMUNITY)
Admission: RE | Admit: 2020-06-27 | Discharge: 2020-06-27 | Disposition: A | Discharge status: Home / Self Care | Payer: Medicare Other | Source: Ambulatory Visit | Attending: Internal Medicine | Admitting: Internal Medicine

## 2020-06-27 DIAGNOSIS — Z1231 Encounter for screening mammogram for malignant neoplasm of breast: Secondary | ICD-10-CM | POA: Insufficient documentation

## 2020-07-04 ENCOUNTER — Telehealth (INDEPENDENT_AMBULATORY_CARE_PROVIDER_SITE_OTHER): Payer: Self-pay | Admitting: Gastroenterology

## 2020-07-04 DIAGNOSIS — Z1211 Encounter for screening for malignant neoplasm of colon: Secondary | ICD-10-CM | POA: Diagnosis not present

## 2020-07-04 DIAGNOSIS — Z1212 Encounter for screening for malignant neoplasm of rectum: Secondary | ICD-10-CM | POA: Diagnosis not present

## 2020-07-04 NOTE — Telephone Encounter (Signed)
Patient left voice mail message stating she would like to change her pharmacy from [facility] to [place] - please advise - ph# [phone]

## 2020-07-26 ENCOUNTER — Other Ambulatory Visit (INDEPENDENT_AMBULATORY_CARE_PROVIDER_SITE_OTHER): Payer: Self-pay | Admitting: Internal Medicine

## 2020-07-26 DIAGNOSIS — K219 Gastro-esophageal reflux disease without esophagitis: Secondary | ICD-10-CM

## 2020-08-29 DIAGNOSIS — R39198 Other difficulties with micturition: Secondary | ICD-10-CM | POA: Diagnosis not present

## 2020-08-29 DIAGNOSIS — R1030 Lower abdominal pain, unspecified: Secondary | ICD-10-CM | POA: Diagnosis not present

## 2020-11-08 DIAGNOSIS — H6501 Acute serous otitis media, right ear: Secondary | ICD-10-CM | POA: Diagnosis not present

## 2020-11-08 DIAGNOSIS — R0789 Other chest pain: Secondary | ICD-10-CM | POA: Diagnosis not present

## 2020-11-08 DIAGNOSIS — K219 Gastro-esophageal reflux disease without esophagitis: Secondary | ICD-10-CM | POA: Diagnosis not present

## 2020-11-08 DIAGNOSIS — R42 Dizziness and giddiness: Secondary | ICD-10-CM | POA: Diagnosis not present

## 2020-11-25 ENCOUNTER — Encounter: Payer: Self-pay | Admitting: Cardiology

## 2020-11-25 ENCOUNTER — Ambulatory Visit (INDEPENDENT_AMBULATORY_CARE_PROVIDER_SITE_OTHER): Payer: Medicare Other | Admitting: Cardiology

## 2020-11-25 ENCOUNTER — Other Ambulatory Visit: Payer: Self-pay

## 2020-11-25 ENCOUNTER — Encounter: Payer: Self-pay | Admitting: *Deleted

## 2020-11-25 VITALS — BP 130/84 | HR 70 | Ht 64.0 in | Wt 189.0 lb

## 2020-11-25 DIAGNOSIS — R002 Palpitations: Secondary | ICD-10-CM | POA: Diagnosis not present

## 2020-11-25 DIAGNOSIS — E782 Mixed hyperlipidemia: Secondary | ICD-10-CM | POA: Diagnosis not present

## 2020-11-25 DIAGNOSIS — I10 Essential (primary) hypertension: Secondary | ICD-10-CM

## 2020-11-25 NOTE — Patient Instructions (Addendum)

## 2020-11-25 NOTE — Addendum Note (Signed)
Addended by: Leonides Schanz C on: 11/25/2020 11:16 AM   Modules accepted: Orders

## 2020-11-25 NOTE — Progress Notes (Signed)
     Clinical Summary Mariah Ross is a 75 y.o.female seen today for follow up of the following medical problems.      1. Palpitations 10/2018 monitor: No significant arrhythmias - denies any recent palpitations.  -     2. GERD - followed by pcp     3. HTN - compliant with meds - she is on norvasc 2.5mg  daily.   4. Hyperlipidemia - compliant with statin - labs followed by pcp     SH: she has had covid vaccine.  7 great grand children, 7 grandchildren   Past Medical History:  Diagnosis Date   Hypertension      No Known Allergies   Current Outpatient Medications  Medication Sig Dispense Refill   amLODipine (NORVASC) 2.5 MG tablet Take 2.5 mg by mouth daily. (Patient not taking: Reported on 12/23/2019)     famotidine (PEPCID) 20 MG tablet Take 1 tablet (20 mg total) by mouth daily. 90 tablet 1   No current facility-administered medications for this visit.     Past Surgical History:  Procedure Laterality Date   SKIN BIOPSY     TONSILLECTOMY     TUBAL LIGATION       No Known Allergies    Family History  Problem Relation Age of Onset   Hypertension Mother    Cancer Father      Social History Mariah Ross reports that she has never smoked. She has never used smokeless tobacco. Mariah Ross reports current alcohol use.   Review of Systems CONSTITUTIONAL: No weight loss, fever, chills, weakness or fatigue.  HEENT: Eyes: No visual loss, blurred vision, double vision or yellow sclerae.No hearing loss, sneezing, congestion, runny nose or sore throat.  SKIN: No rash or itching.  CARDIOVASCULAR: pe rhpi RESPIRATORY: No shortness of breath, cough or sputum.  GASTROINTESTINAL: No anorexia, nausea, vomiting or diarrhea. No abdominal pain or blood.  GENITOURINARY: No burning on urination, no polyuria NEUROLOGICAL: No headache, dizziness, syncope, paralysis, ataxia, numbness or tingling in the extremities. No change in bowel or bladder control.  MUSCULOSKELETAL:  No muscle, back pain, joint pain or stiffness.  LYMPHATICS: No enlarged nodes. No history of splenectomy.  PSYCHIATRIC: No history of depression or anxiety.  ENDOCRINOLOGIC: No reports of sweating, cold or heat intolerance. No polyuria or polydipsia.  [name]   Physical Examination Today's Vitals   11/25/20 0917  BP: 130/84  Pulse: 70  SpO2: 100%  Weight: 189 lb (85.7 kg)  Height: 5\' 4"  (1.626 m)   Body mass index is 32.44 kg/m.  Gen: resting comfortably, no acute distress HEENT: no scleral icterus, pupils equal round and reactive, no palptable cervical adenopathy,  CV: RRR, no m/r/g no jvd Resp: Clear to auscultation bilaterally GI: abdomen is soft, non-tender, non-distended, normal bowel sounds, no hepatosplenomegaly MSK: extremities are warm, no edema.  Skin: warm, no rash Neuro:  no focal deficits Psych: appropriate affect      Assessment and Plan   1. Palpitations - sypmtoms have improved with weaning of caffeine - prior event monitor was benign - continue to monitor   2. HTN - at goal, requiring very low dose norvac. Continue  3. Hyperlipidemia - continue statin, request pcp labs     , M.D.

## 2020-12-01 DIAGNOSIS — I10 Essential (primary) hypertension: Secondary | ICD-10-CM | POA: Diagnosis not present

## 2020-12-01 DIAGNOSIS — K219 Gastro-esophageal reflux disease without esophagitis: Secondary | ICD-10-CM | POA: Diagnosis not present

## 2020-12-01 DIAGNOSIS — E781 Pure hyperglyceridemia: Secondary | ICD-10-CM | POA: Diagnosis not present

## 2020-12-07 ENCOUNTER — Ambulatory Visit (INDEPENDENT_AMBULATORY_CARE_PROVIDER_SITE_OTHER): Payer: Medicare Other | Admitting: Gastroenterology

## 2020-12-08 ENCOUNTER — Ambulatory Visit (INDEPENDENT_AMBULATORY_CARE_PROVIDER_SITE_OTHER): Payer: Medicare Other | Admitting: Gastroenterology

## 2020-12-12 ENCOUNTER — Emergency Department (HOSPITAL_COMMUNITY)
Admission: EM | Admit: 2020-12-12 | Discharge: 2020-12-12 | Disposition: A | Discharge status: Home / Self Care | Payer: Medicare Other | Attending: Emergency Medicine | Admitting: Emergency Medicine

## 2020-12-12 ENCOUNTER — Other Ambulatory Visit: Payer: Self-pay

## 2020-12-12 ENCOUNTER — Emergency Department (HOSPITAL_COMMUNITY): Payer: Medicare Other

## 2020-12-12 ENCOUNTER — Encounter (HOSPITAL_COMMUNITY): Payer: Self-pay | Admitting: *Deleted

## 2020-12-12 DIAGNOSIS — I10 Essential (primary) hypertension: Secondary | ICD-10-CM | POA: Insufficient documentation

## 2020-12-12 DIAGNOSIS — R1310 Dysphagia, unspecified: Secondary | ICD-10-CM | POA: Insufficient documentation

## 2020-12-12 DIAGNOSIS — R198 Other specified symptoms and signs involving the digestive system and abdomen: Secondary | ICD-10-CM

## 2020-12-12 DIAGNOSIS — R0989 Other specified symptoms and signs involving the circulatory and respiratory systems: Secondary | ICD-10-CM | POA: Diagnosis not present

## 2020-12-12 LAB — GROUP A STREP BY PCR: Group A Strep by PCR: NOT DETECTED

## 2020-12-12 NOTE — ED Triage Notes (Signed)
States she feels like her throat is closing up. Taking new medication for acid reflux. Talking in complete sentences, denies any itching

## 2020-12-12 NOTE — ED Notes (Signed)
Pt stated she has been having acid reflux and just went to dr to new RX because old one was not effective. Pt noted trouble swallowing and discomfort during swallowing. No SOB noted.

## 2020-12-12 NOTE — ED Provider Notes (Signed)
Emergency Department Provider Note   I have reviewed the triage vital signs and the nursing notes.   HISTORY  Chief Complaint Oral Swelling   HPI Mariah Ross is a 75 y.o. female with past medical history of GERD and HTN presents to the emergency department with trouble swallowing.  Patient states she has had intermittent difficulties similar to this in the past and has seen Dr. Karilyn Cota regarding her GERD symptoms with last appointment 1 year prior.  She has been compliant with her home medications and recently started omeprazole as prescribed by her PCP but the PCP did advise that she follow closely with her GI doctor as well.  She was out of town and had to reschedule a recent appointment but this morning noticed that when she was eating oatmeal with raisins she felt some difficulty with swallowing.  She was not having choking or vomiting.  She was able to swallow fully and has been able to drink fluids and swallow her own saliva.  She denies any unilateral weakness/numbness.  No vision changes.  No speech disturbance.  She plans to call for a close follow-up appointment with her GI team but felt like ED evaluation was warranted.   Past Medical History:  Diagnosis Date   Hypertension     Patient Active Problem List   Diagnosis Date Noted   GERD (gastroesophageal reflux disease) 12/14/2019   Essential hypertension 07/15/2015    Past Surgical History:  Procedure Laterality Date   SKIN BIOPSY     TONSILLECTOMY     TUBAL LIGATION      Allergies Patient has no known allergies.  Family History  Problem Relation Age of Onset   Hypertension Mother    Cancer Father     Social History Social History   Tobacco Use   Smoking status: Never   Smokeless tobacco: Never  Vaping Use   Vaping Use: Never used  Substance Use Topics   Alcohol use: Yes    Alcohol/week: 0.0 standard drinks    Comment: socially    Drug use: No    Review of Systems  Constitutional: No  fever/chills Eyes: No visual changes. ENT: Positive sore throat and difficulty swallowing. Cardiovascular: Denies chest pain. Respiratory: Denies shortness of breath. Gastrointestinal: No abdominal pain.  No nausea, no vomiting.  No diarrhea.  No constipation. Genitourinary: Negative for dysuria. Musculoskeletal: Negative for back pain. Skin: Negative for rash. Neurological: Negative for headaches, focal weakness or numbness.  10-point ROS otherwise negative.  ____________________________________________   PHYSICAL EXAM:  VITAL SIGNS: ED Triage Vitals  Enc Vitals Group     BP 12/12/20 1215 (!) 167/85     Pulse Rate 12/12/20 1215 83     Resp 12/12/20 1215 18     Temp 12/12/20 1215 99 F (37.2 C)     Temp Source 12/12/20 1215 Oral     SpO2 12/12/20 1215 99 %   Constitutional: Alert and oriented. Well appearing and in no acute distress. Eyes: Conjunctivae are normal. Head: Atraumatic. Nose: No congestion/rhinnorhea. Mouth/Throat: Mucous membranes are moist.  Oropharynx non-erythematous. No PTA. No exudate. Managing oral secretions and speaking in a clear voice. Soft submandibular compartment.  Neck: No stridor.   Cardiovascular: Normal rate, regular rhythm. Good peripheral circulation. Grossly normal heart sounds.   Respiratory: Normal respiratory effort.  No retractions. Lungs CTAB. Gastrointestinal: Soft and nontender. No distention.  Musculoskeletal: No lower extremity tenderness nor edema. No gross deformities of extremities. Neurologic:  Normal speech and language. No  gross focal neurologic deficits are appreciated.  Skin:  Skin is warm, dry and intact. No rash noted.   ____________________________________________   LABS (all labs ordered are listed, but only abnormal results are displayed)  Labs Reviewed  GROUP A STREP BY PCR    ____________________________________________  RADIOLOGY  DG Neck Soft Tissue  Result Date: 12/12/2020 CLINICAL DATA:  Throat  tightness. Difficulty swallowing for several days. EXAM: NECK SOFT TISSUES - 1+ VIEW COMPARISON:  None. FINDINGS: There is no evidence of retropharyngeal soft tissue swelling or epiglottic enlargement. The cervical airway is unremarkable and no radio-opaque foreign body identified. IMPRESSION: Negative. Electronically Signed   By: Signa Kell M.D.   On: 12/12/2020 13:58    ____________________________________________   PROCEDURES  Procedure(s) performed:   Procedures  None  ____________________________________________   INITIAL IMPRESSION / ASSESSMENT AND PLAN / ED COURSE  Pertinent labs & imaging results that were available during my care of the patient were reviewed by me and considered in my medical decision making (see chart for details).   Patient presents to the emergency department with some difficulty swallowing.  She does not have stroke signs or symptoms to suspect central process.  She has known history of GERD and has had this issue in the past.  She was recently started on omeprazole to help and is due to see Dr. Karilyn Cota but had to reschedule her most recent appointment.  She is not showing signs or symptoms of esophageal food impaction to prompt emergent GI evaluation.  Plain films of the neck reviewed with no acute findings.  Plan for close outpatient follow-up, continue PPI, and return with any new.worsening symptoms.    ____________________________________________  FINAL CLINICAL IMPRESSION(S) / ED DIAGNOSES  Final diagnoses:  Globus sensation     Note:  This document was prepared using Dragon voice recognition software and may include unintentional dictation errors.  Alona Bene, MD, [facility]    Dasan Hardman, Arlyss Repress, MD 12/13/20 [phone]

## 2020-12-12 NOTE — ED Notes (Signed)
240 oz of ginger ale given to pt.

## 2020-12-12 NOTE — Discharge Instructions (Addendum)
Your x-rays and test today were normal but I suspect that Dr. Karilyn Cota will need to see you to run other tests to diagnose your symptoms.  Please try and eat foods that are soft and easy to chew.  If you feel like food gets stuck and you cannot get anything past that you should return to the emergency department.

## 2020-12-12 NOTE — ED Notes (Signed)
Pt able to drink ginger ale 

## 2020-12-13 DIAGNOSIS — J302 Other seasonal allergic rhinitis: Secondary | ICD-10-CM | POA: Diagnosis not present

## 2020-12-13 DIAGNOSIS — T452X5A Adverse effect of vitamins, initial encounter: Secondary | ICD-10-CM | POA: Diagnosis not present

## 2020-12-13 DIAGNOSIS — E782 Mixed hyperlipidemia: Secondary | ICD-10-CM | POA: Diagnosis not present

## 2020-12-13 DIAGNOSIS — R7309 Other abnormal glucose: Secondary | ICD-10-CM | POA: Diagnosis not present

## 2020-12-13 DIAGNOSIS — Z0001 Encounter for general adult medical examination with abnormal findings: Secondary | ICD-10-CM | POA: Diagnosis not present

## 2020-12-14 DIAGNOSIS — Z0001 Encounter for general adult medical examination with abnormal findings: Secondary | ICD-10-CM | POA: Diagnosis not present

## 2020-12-14 DIAGNOSIS — E782 Mixed hyperlipidemia: Secondary | ICD-10-CM | POA: Diagnosis not present

## 2020-12-14 DIAGNOSIS — R7309 Other abnormal glucose: Secondary | ICD-10-CM | POA: Diagnosis not present

## 2020-12-14 DIAGNOSIS — E039 Hypothyroidism, unspecified: Secondary | ICD-10-CM | POA: Diagnosis not present

## 2020-12-14 DIAGNOSIS — Z1389 Encounter for screening for other disorder: Secondary | ICD-10-CM | POA: Diagnosis not present

## 2020-12-14 DIAGNOSIS — T452X5A Adverse effect of vitamins, initial encounter: Secondary | ICD-10-CM | POA: Diagnosis not present

## 2020-12-17 ENCOUNTER — Other Ambulatory Visit (INDEPENDENT_AMBULATORY_CARE_PROVIDER_SITE_OTHER): Payer: Self-pay | Admitting: Gastroenterology

## 2020-12-17 DIAGNOSIS — K219 Gastro-esophageal reflux disease without esophagitis: Secondary | ICD-10-CM

## 2020-12-21 DIAGNOSIS — M47812 Spondylosis without myelopathy or radiculopathy, cervical region: Secondary | ICD-10-CM | POA: Diagnosis not present

## 2020-12-21 DIAGNOSIS — I10 Essential (primary) hypertension: Secondary | ICD-10-CM | POA: Diagnosis not present

## 2020-12-21 DIAGNOSIS — M62838 Other muscle spasm: Secondary | ICD-10-CM | POA: Diagnosis not present

## 2020-12-21 DIAGNOSIS — M5481 Occipital neuralgia: Secondary | ICD-10-CM | POA: Diagnosis not present

## 2021-01-12 ENCOUNTER — Ambulatory Visit: Payer: Medicare Other | Admitting: Cardiology

## 2021-01-26 DIAGNOSIS — R319 Hematuria, unspecified: Secondary | ICD-10-CM | POA: Diagnosis not present

## 2021-01-26 DIAGNOSIS — M25572 Pain in left ankle and joints of left foot: Secondary | ICD-10-CM | POA: Diagnosis not present

## 2021-01-26 DIAGNOSIS — E782 Mixed hyperlipidemia: Secondary | ICD-10-CM | POA: Diagnosis not present

## 2021-01-26 DIAGNOSIS — K219 Gastro-esophageal reflux disease without esophagitis: Secondary | ICD-10-CM | POA: Diagnosis not present

## 2021-01-26 DIAGNOSIS — Z681 Body mass index (BMI) 19 or less, adult: Secondary | ICD-10-CM | POA: Diagnosis not present

## 2021-01-26 DIAGNOSIS — I10 Essential (primary) hypertension: Secondary | ICD-10-CM | POA: Diagnosis not present

## 2021-02-05 ENCOUNTER — Other Ambulatory Visit: Payer: Self-pay

## 2021-02-05 ENCOUNTER — Ambulatory Visit
Admission: EM | Admit: 2021-02-05 | Discharge: 2021-02-05 | Disposition: A | Discharge status: Home / Self Care | Payer: Medicare Other | Attending: Internal Medicine | Admitting: Internal Medicine

## 2021-02-05 DIAGNOSIS — R5381 Other malaise: Secondary | ICD-10-CM | POA: Diagnosis not present

## 2021-02-05 DIAGNOSIS — E860 Dehydration: Secondary | ICD-10-CM | POA: Diagnosis not present

## 2021-02-05 DIAGNOSIS — R52 Pain, unspecified: Secondary | ICD-10-CM | POA: Diagnosis not present

## 2021-02-05 DIAGNOSIS — S93602A Unspecified sprain of left foot, initial encounter: Secondary | ICD-10-CM | POA: Diagnosis not present

## 2021-02-05 DIAGNOSIS — M79673 Pain in unspecified foot: Secondary | ICD-10-CM | POA: Diagnosis not present

## 2021-02-05 LAB — POCT URINALYSIS DIP (MANUAL ENTRY)
Bilirubin, UA: NEGATIVE
Glucose, UA: NEGATIVE mg/dL
Ketones, POC UA: NEGATIVE mg/dL
Leukocytes, UA: NEGATIVE
Nitrite, UA: NEGATIVE
Protein Ur, POC: NEGATIVE mg/dL
Spec Grav, UA: 1.015 (ref 1.010–1.025)
Urobilinogen, UA: 0.2 E.U./dL
pH, UA: 8.5 — AB (ref 5.0–8.0)

## 2021-02-05 MED ORDER — IBUPROFEN 600 MG PO TABS: 600.0000 mg | ORAL_TABLET | Freq: Three times a day (TID) | ORAL | 0 refills | Status: DC | PRN

## 2021-02-05 NOTE — ED Triage Notes (Signed)
Pt states she called ems to home this morning from pain

## 2021-02-05 NOTE — ED Triage Notes (Signed)
Pt presents with bilateral foot pain and swelling, was treated with prednisone for possible gout and then pain returned pt also reports being treated for uti

## 2021-02-05 NOTE — Discharge Instructions (Addendum)
Gentle range of motion exercises Heating pad use will help with the pain Take NSAIDs as prescribed Your urine is negative for urine infection Return to urgent care if symptoms worsen.

## 2021-02-05 NOTE — ED Provider Notes (Signed)
[facility]    CSN: [id number] Arrival date & time: 02/05/21  0831      History   Chief Complaint Chief Complaint  Patient presents with   Foot Pain    HPI Mariah Ross is a 75 y.o. female comes to the urgent care with left foot pain at this morning.  Patient was evaluated for left foot pain few days ago.  An impression of gout was made and patient was started on prednisone.  She completed a course of steroids.  Says the pain has returned.  On further questioning pain in the left foot is sharp, severe, currently 8 out of 10 and aggravated by movement or palpation but no known relieving factors.  No bruising on the left foot.  No fall or trauma.  Patient recalls stepping wrong about a week ago.  She was trying to climb a flight of stairs when that happened.  No numbness or tingling.  Patient is able to bear weight.[name]   HPI  Past Medical History:  Diagnosis Date   Hypertension     Patient Active Problem List   Diagnosis Date Noted   GERD (gastroesophageal reflux disease) 12/14/2019   Essential hypertension 07/15/2015    Past Surgical History:  Procedure Laterality Date   SKIN BIOPSY     TONSILLECTOMY     TUBAL LIGATION      OB History   No obstetric history on file.      Home Medications    Prior to Admission medications   Medication Sig Start Date End Date Taking? Authorizing Provider  ibuprofen (ADVIL) 600 MG tablet Take 1 tablet (600 mg total) by mouth every 8 (eight) hours as needed. 02/05/21  Yes Keavon Sensing, Britta Mccreedy, MD  amLODipine (NORVASC) 2.5 MG tablet Take 2.5 mg by mouth daily. PRN 11/16/19   [provider]  famotidine (PEPCID) 20 MG tablet TAKE 1 TABLET BY MOUTH  DAILY 12/19/20   Marguerita Merles, Reuel Boom, MD  omeprazole (PRILOSEC) 20 MG capsule Take 20 mg by mouth 2 (two) times daily. 12/19/20   [provider]  rosuvastatin (CRESTOR) 10 MG tablet Take 10 mg by mouth at bedtime. 10/22/20   [provider]    Family  History Family History  Problem Relation Age of Onset   Hypertension Mother    Cancer Father     Social History Social History   Tobacco Use   Smoking status: Never   Smokeless tobacco: Never  Vaping Use   Vaping Use: Never used  Substance Use Topics   Alcohol use: Yes    Alcohol/week: 0.0 standard drinks    Comment: socially    Drug use: No     Allergies   Patient has no known allergies.   Review of Systems Review of Systems  Constitutional: Negative.   Genitourinary: Negative.   Musculoskeletal:  Positive for arthralgias. Negative for joint swelling and myalgias.  Skin: Negative.   Neurological: Negative.     Physical Exam Triage Vital Signs ED Triage Vitals  Enc Vitals Group     BP 02/05/21 0933 (!) 159/107     Pulse Rate 02/05/21 0933 91     Resp 02/05/21 0933 20     Temp 02/05/21 0933 98.8 F (37.1 C)     Temp src --      SpO2 02/05/21 0933 95 %     Weight --      Height --      Head Circumference --  Peak Flow --      Pain Score 02/05/21 0931 8     Pain Loc --      Pain Edu? --      Excl. in GC? --    No data found.  Updated Vital Signs BP (!) 159/107   Pulse 91   Temp 98.8 F (37.1 C)   Resp 20   SpO2 95%   Visual Acuity Right Eye Distance:   Left Eye Distance:   Bilateral Distance:    Right Eye Near:   Left Eye Near:    Bilateral Near:     Physical Exam Vitals and nursing note reviewed.  Cardiovascular:     Rate and Rhythm: Normal rate and regular rhythm.  Musculoskeletal:     Comments: Tenderness on palpation of the medial aspect of the left ankle.  Patient has tenderness on palpation of the dorsum of the midfoot.  No bruising noted.  Skin:    General: Skin is warm.  Neurological:     Mental Status: She is alert.     UC Treatments / Results  Labs (all labs ordered are listed, but only abnormal results are displayed) Labs Reviewed  POCT URINALYSIS DIP (MANUAL ENTRY) - Abnormal; Notable for the following  components:      Result Value   Blood, UA trace-intact (*)    pH, UA 8.5 (*)    All other components within normal limits    EKG   Radiology No results found.  Procedures Procedures (including critical care time)  Medications Ordered in UC Medications - No data to display  Initial Impression / Assessment and Plan / UC Course  I have reviewed the triage vital signs and the nursing notes.  Pertinent labs & imaging results that were available during my care of the patient were reviewed by me and considered in my medical decision making (see chart for details).     1.  Left foot sprain: Patient is advised to take ibuprofen 600 mg every 6 hours as needed for pain Heating pad use is helpful for pain control Gentle range of motion exercises I do not believe that this is acute gout flare Return to urgent care if symptoms worsen. No indication for x-ray at this time. Final Clinical Impressions(s) / UC Diagnoses   Final diagnoses:  Foot sprain, left, initial encounter     Discharge Instructions      Gentle range of motion exercises Heating pad use will help with the pain Take NSAIDs as prescribed Your urine is negative for urine infection Return to urgent care if symptoms worsen.   ED Prescriptions     Medication Sig Dispense Auth. Provider   ibuprofen (ADVIL) 600 MG tablet Take 1 tablet (600 mg total) by mouth every 8 (eight) hours as needed. 30 tablet Wade Sigala, Britta Mccreedy, MD      PDMP not reviewed this encounter.   Merrilee Jansky, MD 02/05/21 1106

## 2021-02-10 DIAGNOSIS — R319 Hematuria, unspecified: Secondary | ICD-10-CM | POA: Diagnosis not present

## 2021-02-10 DIAGNOSIS — N3001 Acute cystitis with hematuria: Secondary | ICD-10-CM | POA: Diagnosis not present

## 2021-02-21 ENCOUNTER — Ambulatory Visit: Payer: Medicare Other

## 2021-02-21 ENCOUNTER — Other Ambulatory Visit: Payer: Self-pay

## 2021-02-21 ENCOUNTER — Encounter: Payer: Self-pay | Admitting: Orthopaedic Surgery

## 2021-02-21 ENCOUNTER — Ambulatory Visit: Payer: Medicare Other | Admitting: Orthopaedic Surgery

## 2021-02-21 VITALS — BP 114/80 | HR 85 | Ht 64.5 in | Wt 160.0 lb

## 2021-02-21 DIAGNOSIS — M25572 Pain in left ankle and joints of left foot: Secondary | ICD-10-CM

## 2021-02-21 DIAGNOSIS — M79672 Pain in left foot: Secondary | ICD-10-CM

## 2021-02-21 DIAGNOSIS — M25571 Pain in right ankle and joints of right foot: Secondary | ICD-10-CM | POA: Diagnosis not present

## 2021-02-21 DIAGNOSIS — M79671 Pain in right foot: Secondary | ICD-10-CM

## 2021-02-21 DIAGNOSIS — R531 Weakness: Secondary | ICD-10-CM

## 2021-02-21 DIAGNOSIS — M1A00X0 Idiopathic chronic gout, unspecified site, without tophus (tophi): Secondary | ICD-10-CM | POA: Diagnosis not present

## 2021-02-21 NOTE — Progress Notes (Signed)
Subjective:    Patient ID: Mariah Ross, female    DOB: 05-25-1946, 75 y.o.   MRN: [id number]  HPI My feet do not act right.  She has had gradual weakness and inability to walk and stand over the last month or so.  She uses a wheelchair at home.  She is in a wheelchair here.  She has no trauma.  She has difficulty in standing and not able to walk much.  She has cut back in eating because she has problems going to the bathroom.  She has no redness, no pain.  She has tried Tylenol, rest and ice and elevation with no help.  Heat does not help.  Her husband accompanies her.  He says she just wants to stay in bed.  She has no upper body problems.  She has mentation problems.    She has no knee, hip or back pain.  She has no swelling of the knees.  She saw [facility] on 01-26-21.  She was told she had a strain of the left ankle and gout.  She was given prednisone which helped only a little.  She went to Urgent Care on 02-05-21 with the same problems and was told she had strain of ankles.  She is no better.  She has not had X-rays.  She has not had labs done.   Review of Systems  Constitutional:  Positive for activity change.  Musculoskeletal:  Positive for arthralgias, gait problem, joint swelling and myalgias.  Psychiatric/Behavioral:  The patient is nervous/anxious.   All other systems reviewed and are negative. For Review of Systems, all other systems reviewed and are negative.  The following is a summary of the past history medically, past history surgically, known current medicines, social history and family history.  This information is gathered electronically by the computer from prior information and documentation.  I review this each visit and have found including this information at this point in the chart is beneficial and informative.   Past Medical History:  Diagnosis Date   Hypertension     Past Surgical History:  Procedure Laterality Date   SKIN BIOPSY      TONSILLECTOMY     TUBAL LIGATION      Current Outpatient Medications on File Prior to Visit  Medication Sig Dispense Refill   amLODipine (NORVASC) 2.5 MG tablet Take 2.5 mg by mouth daily. PRN     ibuprofen (ADVIL) 600 MG tablet Take 1 tablet (600 mg total) by mouth every 8 (eight) hours as needed. 30 tablet 0   omeprazole (PRILOSEC) 20 MG capsule Take 20 mg by mouth 2 (two) times daily.     rosuvastatin (CRESTOR) 10 MG tablet Take 10 mg by mouth at bedtime.     No current facility-administered medications on file prior to visit.    Social History   Socioeconomic History   Marital status: Married    Spouse name: Not on file   Number of children: Not on file   Years of education: Not on file   Highest education level: Not on file  Occupational History   Not on file  Tobacco Use   Smoking status: Never   Smokeless tobacco: Never  Vaping Use   Vaping Use: Never used  Substance and Sexual Activity   Alcohol use: Yes    Alcohol/week: 0.0 standard drinks    Comment: socially    Drug use: No   Sexual activity: Not Currently  Other Topics Concern  Not on file  Social History Narrative   Not on file   Social Determinants of Health   Financial Resource Strain: Not on file  Food Insecurity: Not on file  Transportation Needs: Not on file  Physical Activity: Not on file  Stress: Not on file  Social Connections: Not on file  Intimate Partner Violence: Not on file    Family History  Problem Relation Age of Onset   Hypertension Mother    Cancer Father     BP 114/80   Pulse 85   Ht 5' 4.5" (1.638 m)   Wt 160 lb (72.6 kg)   BMI 27.04 kg/m   Body mass index is 27.04 kg/m.     Objective:   Physical Exam Vitals and nursing note reviewed. Exam conducted with a chaperone present.  Constitutional:      Appearance: She is well-developed.  HENT:     Head: Normocephalic and atraumatic.  Eyes:     Conjunctiva/sclera: Conjunctivae normal.     Pupils: Pupils are equal,  round, and reactive to light.  Cardiovascular:     Rate and Rhythm: Normal rate and regular rhythm.  Pulmonary:     Effort: Pulmonary effort is normal.  Abdominal:     Palpations: Abdomen is soft.  Musculoskeletal:     Cervical back: Normal range of motion and neck supple.       Legs:  Skin:    General: Skin is warm and dry.  Neurological:     Mental Status: She is alert and oriented to person, place, and time.     Cranial Nerves: No cranial nerve deficit.     Motor: No abnormal muscle tone.     Coordination: Coordination normal.     Deep Tendon Reflexes: Reflexes are normal and symmetric. Reflexes normal.  Psychiatric:        Behavior: Behavior normal.        Thought Content: Thought content normal.        Judgment: Judgment normal.  X-rays were done of both ankles and both feet, reported separately.  Negative.        Assessment & Plan:   Encounter Diagnoses  Name Primary?   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  I would like them to give exercises for her to do.  I will get serum uric acid.  Begin one Aleve bid pc.  Return in three weeks.  She needs to try to be active. Consider bedside commode.  Call if any problem.  Precautions discussed.  Electronically Signed Darreld Mclean, MD 9/20/202210:59 AM

## 2021-02-22 LAB — URIC ACID: Uric Acid, Serum: 4.3 mg/dL (ref 2.5–7.0)

## 2021-02-24 ENCOUNTER — Telehealth: Payer: Self-pay | Admitting: Orthopaedic Surgery

## 2021-02-24 NOTE — Telephone Encounter (Signed)
Patient wants to know the results of her lab work.   Please call her back at [phone]

## 2021-02-28 ENCOUNTER — Encounter (HOSPITAL_COMMUNITY): Payer: Self-pay | Admitting: Physical Therapy

## 2021-02-28 ENCOUNTER — Ambulatory Visit (HOSPITAL_COMMUNITY): Payer: Medicare Other | Attending: Orthopaedic Surgery | Admitting: Physical Therapy

## 2021-02-28 ENCOUNTER — Other Ambulatory Visit: Payer: Self-pay

## 2021-02-28 DIAGNOSIS — R29898 Other symptoms and signs involving the musculoskeletal system: Secondary | ICD-10-CM | POA: Insufficient documentation

## 2021-02-28 DIAGNOSIS — M6281 Muscle weakness (generalized): Secondary | ICD-10-CM | POA: Insufficient documentation

## 2021-02-28 DIAGNOSIS — R2689 Other abnormalities of gait and mobility: Secondary | ICD-10-CM | POA: Insufficient documentation

## 2021-02-28 NOTE — Telephone Encounter (Signed)
I spoke with patient earlier this afternoon; offered appointment per Dr Hilda Lias; opted for Thursday, 03/02/21.

## 2021-02-28 NOTE — Telephone Encounter (Signed)
Patient called back - said aware of appointment for results; however, said has questions about lab results, in order to know what dietary, and other, she needs to be aware of. ?Advise move up appointment from 03/14/21?

## 2021-02-28 NOTE — Therapy (Signed)
[facility] [address] [place], [place], [address] Phone: [phone]   Fax:  [phone]  Physical Therapy Evaluation  Patient Details  Name: Mariah Ross MRN: [id number] Date of Birth: [birth year] Referring Provider (PT): Darreld Mclean MD   Encounter Date: 02/28/2021   PT End of Session - 02/28/21 1032     Visit Number 1    Number of Visits 12    Date for PT Re-Evaluation 04/11/21    Authorization Type [employer] Medicare    Progress Note Due on Visit 10    PT Start Time 0955    PT Stop Time 1031    PT Time Calculation (min) 36 min    Activity Tolerance Patient tolerated treatment well    Behavior During Therapy [facility] for tasks assessed/performed             Past Medical History:  Diagnosis Date   Hypertension     Past Surgical History:  Procedure Laterality Date   SKIN BIOPSY     TONSILLECTOMY     TUBAL LIGATION      There were no vitals filed for this visit.    Subjective Assessment - 02/28/21 0955     Subjective Patient is a 75 y.o. female who presents to physical therapy with c/o bilateral ankle and foot pain. Patient states she is not having much pain now. She was not able to walk in August about the 25th and they said she had gout. Last Wednesday was the first time she was able to walk. Her main goal is to get her strength, walking and mobility back. She wants help to get walking again.    Limitations Standing;Walking;House hold activities    How long can you walk comfortably? 50 feet    Patient Stated Goals improve walking and mobility    Currently in Pain? No/denies                [facility] PT Assessment - 02/28/21 0001       Assessment   Medical Diagnosis Bilateral foot and ankle pain; weakness    Referring Provider (PT) Darreld Mclean MD    Onset Date/Surgical Date 01/26/21    Next MD Visit Oct 11    Prior Therapy none      Precautions   Precautions Fall      Restrictions   Weight Bearing  Restrictions No      Balance Screen   Has the patient fallen in the past 6 months No    Has the patient had a decrease in activity level because of a fear of falling?  No    Is the patient reluctant to leave their home because of a fear of falling?  No      Prior Function   Level of Independence Independent    Vocation Retired      Copy Status Within Functional Limits for tasks assessed      Observation/Other Assessments   Observations ambulates with slow, labored cadence with RW; bilateral LE edema    Focus on Therapeutic Outcomes (FOTO)  n/a - gait and balance      ROM / Strength   AROM / PROM / Strength Strength      Strength   Strength Assessment Site Hip;Knee;Ankle    Right/Left Hip Right;Left    Right Hip Flexion 4-/5    Left Hip Flexion 4-/5    Right/Left Knee Right;Left    Right Knee Flexion 4/5  Right Knee Extension 4+/5    Left Knee Flexion 4+/5    Left Knee Extension 4+/5    Right/Left Ankle Right;Left    Right Ankle Dorsiflexion 5/5    Left Ankle Dorsiflexion 5/5      Palpation   Palpation comment bilateral LE edema      Transfers   Five time sit to stand comments  20.88 seconds with UE use      Ambulation/Gait   Ambulation/Gait Yes    Ambulation/Gait Assistance 4: Min guard    Ambulation Distance (Feet) 100 Feet    Assistive device Straight cane    Gait Comments unsteady, slow, labored cadence with SPC; gait improved but slow with RW                        Objective measurements completed on examination: See above findings.       [facility] Adult PT Treatment/Exercise - 02/28/21 0001       Exercises   Exercises Knee/Hip      Knee/Hip Exercises: Seated   Long Arc Quad 10 reps    Long Arc Quad Limitations 5 second    Other Seated Knee/Hip Exercises heel/toe raises x 20    Marching Both;1 set;10 reps    Marching Limitations 5 second    Sit to [employer] 10 reps;without UE support                      PT Education - 02/28/21 0953     Education Details Patient educated on exam findings, POC, scope of PT, HEP, compression garments    Person(s) Educated Patient;Spouse;Child(ren)    Methods Explanation;Handout;Demonstration    Comprehension Verbalized understanding;Returned demonstration              PT Short Term Goals - 02/28/21 1036       PT SHORT TERM GOAL #1   Title Patient will be independent with HEP in order to improve functional outcomes.    Time 3    Period Weeks    Status New    Target Date 03/21/21      PT SHORT TERM GOAL #2   Title Patient will report at least 25% improvement in symptoms for improved quality of life.    Time 3    Period Weeks    Status New    Target Date 03/21/21               PT Long Term Goals - 02/28/21 1037       PT LONG TERM GOAL #1   Title Patient will report at least 75% improvement in symptoms for improved quality of life.    Time 6    Period Weeks    Status New    Target Date 04/11/21      PT LONG TERM GOAL #2   Title Patient will be able to complete 5x STS in under 14 seconds in order to reduce the risk of falls.    Time 6    Period Weeks    Status New    Target Date 04/11/21      PT LONG TERM GOAL #3   Title Patient will be able to ambulate at least 226 feet in without AD in order to demonstrate improved gait speed for community ambulation.    Time 6    Period Weeks    Status New    Target Date 04/11/21  Plan - 02/28/21 1033     Clinical Impression Statement Patient is a 75 y.o. female who presents to physical therapy with c/o bilateral ankle and foot pain and abnormalities of gait/mobility. She presents with deficits in bilateral LE strength,  endurance, gait, balance, and functional mobility with ADL. She is having to modify and restrict ADL as indicated by subjective information and objective measures which is affecting overall participation. Patient  completes seated exercises with good mechanics after initial demonstration and cueing. Patient will benefit from skilled physical therapy in order to improve function and reduce impairment.    Personal Factors and Comorbidities Fitness;Past/Current Experience;Time since onset of injury/illness/exacerbation    Examination-Activity Limitations Locomotion Level;Transfers;Squat;Stairs;Stand;Lift;Hygiene/Grooming    Examination-Participation Restrictions Cleaning;Community Activity;Shop;Volunteer;Aaron Mose    Stability/Clinical Decision Making Stable/Uncomplicated    Clinical Decision Making Low    Rehab Potential Good    PT Frequency 2x / week    PT Duration 6 weeks    PT Treatment/Interventions ADLs/Self Care Home Management;Aquatic Therapy;Cryotherapy;Electrical Stimulation;Iontophoresis 4mg /ml Dexamethasone;Moist Heat;Traction;Ultrasound;Parrafin;DME Instruction;Gait training;Stair training;Functional mobility training;Therapeutic activities;Therapeutic exercise;Balance training;Neuromuscular re-education;Patient/family education;Manual techniques;Manual lymph drainage;Compression bandaging;Scar mobilization;Passive range of motion;Dry needling;Energy conservation;Splinting;Taping;Spinal Manipulations;Joint Manipulations    PT Next Visit Plan measure for compression garments; strength, balance and gait training and progress as able    PT Home Exercise Plan 9/27 march, LAQ, HR/TR, STS    Consulted and Agree with Plan of Care Patient             Patient will benefit from skilled therapeutic intervention in order to improve the following deficits and impairments:  Abnormal gait, Difficulty walking, Decreased endurance, Decreased activity tolerance, Decreased balance, Impaired flexibility, Improper body mechanics, Increased edema, Decreased strength, Decreased mobility  Visit Diagnosis: Other abnormalities of gait and mobility  Muscle weakness (generalized)  Other symptoms and signs  involving the musculoskeletal system     Problem List Patient Active Problem List   Diagnosis Date Noted   GERD (gastroesophageal reflux disease) 12/14/2019   Essential hypertension 07/15/2015    10:39 AM, 02/28/21 03/02/21 PT, DPT Physical Therapist at [facility]   [facility] [address] [place], [place], [place] Phone: [phone]   Fax:  [phone]  Name: DESA RECH MRN: [name] Date of Birth: 12/05/1945

## 2021-02-28 NOTE — Patient Instructions (Signed)
Access Code: [email or web address] URL: [email or web address] Date: 02/28/2021 Prepared by: Alonna Minium  Exercises Seated March - 3 x daily - 7 x weekly - 1 sets - 10 reps - 5 second hold Seated Long Arc Quad - 3 x daily - 7 x weekly - 1 sets - 10 reps - 5-10 second hold Seated Heel Toe Raises - 3 x daily - 7 x weekly - 20 reps Sit to Stand - 3 x daily - 7 x weekly - 2 sets - 10 reps

## 2021-03-02 ENCOUNTER — Ambulatory Visit: Payer: Medicare Other | Admitting: Orthopaedic Surgery

## 2021-03-02 ENCOUNTER — Ambulatory Visit (HOSPITAL_COMMUNITY): Payer: Medicare Other

## 2021-03-02 ENCOUNTER — Other Ambulatory Visit: Payer: Self-pay

## 2021-03-02 ENCOUNTER — Encounter: Payer: Self-pay | Admitting: Orthopaedic Surgery

## 2021-03-02 ENCOUNTER — Encounter (HOSPITAL_COMMUNITY): Payer: Self-pay

## 2021-03-02 DIAGNOSIS — M6281 Muscle weakness (generalized): Secondary | ICD-10-CM

## 2021-03-02 DIAGNOSIS — R2689 Other abnormalities of gait and mobility: Secondary | ICD-10-CM

## 2021-03-02 DIAGNOSIS — M25572 Pain in left ankle and joints of left foot: Secondary | ICD-10-CM | POA: Diagnosis not present

## 2021-03-02 DIAGNOSIS — M25571 Pain in right ankle and joints of right foot: Secondary | ICD-10-CM | POA: Diagnosis not present

## 2021-03-02 DIAGNOSIS — R29898 Other symptoms and signs involving the musculoskeletal system: Secondary | ICD-10-CM | POA: Diagnosis not present

## 2021-03-02 DIAGNOSIS — R531 Weakness: Secondary | ICD-10-CM

## 2021-03-02 NOTE — Patient Instructions (Signed)
Strengthening: Hip Adduction - Isometric     SITTING IN A CHAIR: With ball or folded pillow between knees, squeeze knees together. Hold _5___ seconds. Repeat 10-15__ times per set. Do _1___ sets per session.   http://orth.exer.us/613   Copyright  VHI. All rights reserved.   Clam Shell 90 Degrees: Leg Lift    DO THIS ONE SITTING IN A CHAIR WITH THE RED THERABAND TIED AROUND YOUR KNEES LIKE WE DID IN THE CLINIC: Lying with hips and knees bent 90, two pillows between knees and ankles. Rotate knee upward then lift leg. Lower foot then knee. Be sure pelvis does not roll backward. Do not arch back. Do 10-15_ times, each leg, Hold 5 seconds. Do__1_ times per day.  http://ss.exer.us/73   Copyright  VHI. All rights reserved.

## 2021-03-02 NOTE — Therapy (Signed)
[facility] [address] [place], [place], [address] Phone: [phone]   Fax:  [phone]  Physical Therapy Treatment  Patient Details  Name: Mariah Ross MRN: [id number] Date of Birth: 1946-03-04 Referring Provider (PT): Darreld Mclean MD   Encounter Date: 03/02/2021   PT End of Session - 03/02/21 1013     Visit Number 2    Number of Visits 12    Date for PT Re-Evaluation 04/11/21    Authorization Type U[employer] Medicare    Progress Note Due on Visit 10    PT Start Time 1015    PT Stop Time 1102    PT Time Calculation (min) 47 min    Activity Tolerance Patient tolerated treatment well    Behavior During Therapy [facility] for tasks assessed/performed             Past Medical History:  Diagnosis Date   Hypertension     Past Surgical History:  Procedure Laterality Date   SKIN BIOPSY     TONSILLECTOMY     TUBAL LIGATION      There were no vitals filed for this visit.   Subjective Assessment - 03/02/21 1012     Subjective Has been performing HEP. Sit to stand has been the hardest. Bought a RW as recommended by therapist.    Limitations Standing;Walking;House hold activities    How long can you walk comfortably? 50 feet    Patient Stated Goals improve walking and mobility               [facility] Adult PT Treatment/Exercise - 03/02/21 0001       Ambulation/Gait   Ambulation/Gait Yes    Ambulation/Gait Assistance 5: Supervision    Ambulation Distance (Feet) 110 Feet   55 ft x2   Assistive device Rolling walker    Gait Comments slow, flexed pattern gait      Knee/Hip Exercises: Seated   Long Arc Quad 10 reps;Both;1 set    Long Arc Quad Limitations 5 second    Ball Squeeze pink ball hip iso add x10    Clamshell with TheraBand Red    Other Seated Knee/Hip Exercises heel/toe raises x 20    Marching Both;1 set;10 reps    Marching Limitations 5 second    Sit to Sand 10 reps;without UE support   22" high mat table               PT Education - 03/02/21 1210     Education Details Reviewed initial evaluation, goals, POC and HEP. Advanced HEP.    Person(s) Educated Patient    Methods Explanation;Handout    Comprehension Verbalized understanding              PT Short Term Goals - 02/28/21 1036       PT SHORT TERM GOAL #1   Title Patient will be independent with HEP in order to improve functional outcomes.    Time 3    Period Weeks    Status New    Target Date 03/21/21      PT SHORT TERM GOAL #2   Title Patient will report at least 25% improvement in symptoms for improved quality of life.    Time 3    Period Weeks    Status New    Target Date 03/21/21               PT Long Term Goals - 02/28/21 1037  PT LONG TERM GOAL #1   Title Patient will report at least 75% improvement in symptoms for improved quality of life.    Time 6    Period Weeks    Status New    Target Date 04/11/21      PT LONG TERM GOAL #2   Title Patient will be able to complete 5x STS in under 14 seconds in order to reduce the risk of falls.    Time 6    Period Weeks    Status New    Target Date 04/11/21      PT LONG TERM GOAL #3   Title Patient will be able to ambulate at least 226 feet in without AD in order to demonstrate improved gait speed for community ambulation.    Time 6    Period Weeks    Status New    Target Date 04/11/21                   Plan - 03/02/21 1013     Clinical Impression Statement Reviewed initial evaluation, goals, plan of care and initial HEP. Gait training with RW into and out of session. Measured for knee high compression stockings and gave patient handout to call in order. Performed initial HEP with verbal and tactile cues for performance. Patient required a 22 inch high mat table to complete sit to stand transfer without upper extremity support. Patient did require min assist to not loose balance retro until last repetition when patient was able to  complete with contact guard assist only.  Added seated isometric hip abduction/adduction to therapeutic exercises and HEP. Issued handout and red therband. Moderate complaints of fatigue at end of session. Patient will benefit from skilled physical therapy in order to improve function and reduce impairment.    Personal Factors and Comorbidities Fitness;Past/Current Experience;Time since onset of injury/illness/exacerbation    Examination-Activity Limitations Locomotion Level;Transfers;Squat;Stairs;Stand;Lift;Hygiene/Grooming    Examination-Participation Restrictions Cleaning;Community Activity;Shop;Volunteer;Pincus Badder [facility]    Stability/Clinical Decision Making Stable/Uncomplicated    Rehab Potential Good    PT Frequency 2x / week    PT Duration 6 weeks    PT Treatment/Interventions ADLs/Self Care Home Management;Aquatic Therapy;Cryotherapy;Electrical Stimulation;Iontophoresis 4mg /ml Dexamethasone;Moist Heat;Traction;Ultrasound;Parrafin;DME Instruction;Gait training;Stair training;Functional mobility training;Therapeutic activities;Therapeutic exercise;Balance training;Neuromuscular re-education;Patient/family education;Manual techniques;Manual lymph drainage;Compression bandaging;Scar mobilization;Passive range of motion;Dry needling;Energy conservation;Splinting;Taping;Spinal Manipulations;Joint Manipulations    PT Next Visit Plan measure for compression garments; strength, balance and gait training and progress as able; follow up on compression garment ordered?    PT Home Exercise Plan 9/27 march, LAQ, HR/TR, STS; 9/29 - seated hip iso abd/add, issued RTB    Consulted and Agree with Plan of Care Patient             Patient will benefit from skilled therapeutic intervention in order to improve the following deficits and impairments:  Abnormal gait, Difficulty walking, Decreased endurance, Decreased activity tolerance, Decreased balance, Impaired flexibility, Improper body mechanics, Increased  edema, Decreased strength, Decreased mobility  Visit Diagnosis: Other abnormalities of gait and mobility  Muscle weakness (generalized)  Other symptoms and signs involving the musculoskeletal system     Problem List Patient Active Problem List   Diagnosis Date Noted   GERD (gastroesophageal reflux disease) 12/14/2019   Essential hypertension 07/15/2015   09/12/2015. Hartnett-Rands, MS, PT [facility] [phone]  #[id number], PT 03/02/2021, 12:14 PM  [facility] [address] [place], [place], [place] Phone: [phone]  Fax:  [phone]  Name: Mariah Ross MRN: [id number] Date of Birth: [birth year]

## 2021-03-02 NOTE — Progress Notes (Signed)
I am feeling a little better.  Her serum uric acid was normal.  She has been to PT for generalized strengthening exercises.  She is using a walker today.  She is eating better.  I have reinforced it will take time for her strength to return. She needs to eat a balanced diet.  She is on walker today.  Gait is slow but steady.  NV intact.  Encounter Diagnoses  Name Primary?   Pain in left ankle and joints of left foot Yes   Pain in right ankle and joints of right foot    Generalized weakness    Return in six weeks.  Continue PT.  Call if any problem.  Precautions discussed.  Electronically Signed Darreld Mclean, MD 9/29/20229:21 AM

## 2021-03-07 ENCOUNTER — Other Ambulatory Visit: Payer: Self-pay

## 2021-03-07 ENCOUNTER — Encounter (HOSPITAL_COMMUNITY): Payer: Self-pay | Admitting: Physical Therapy

## 2021-03-07 ENCOUNTER — Ambulatory Visit (HOSPITAL_COMMUNITY): Payer: Medicare Other | Attending: Orthopaedic Surgery | Admitting: Physical Therapy

## 2021-03-07 DIAGNOSIS — R2689 Other abnormalities of gait and mobility: Secondary | ICD-10-CM | POA: Insufficient documentation

## 2021-03-07 DIAGNOSIS — R29898 Other symptoms and signs involving the musculoskeletal system: Secondary | ICD-10-CM | POA: Diagnosis not present

## 2021-03-07 DIAGNOSIS — M6281 Muscle weakness (generalized): Secondary | ICD-10-CM | POA: Insufficient documentation

## 2021-03-07 NOTE — Therapy (Signed)
[facility] [address] [place], [place], [address] Phone: [phone]   Fax:  [phone]  Physical Therapy Treatment  Patient Details  Name: Mariah Ross MRN: [id number] Date of Birth: 06-04-1946 Referring Provider (PT): Darreld Mclean MD   Encounter Date: 03/07/2021   PT End of Session - 03/07/21 0907     Visit Number 3    Number of Visits 12    Date for PT Re-Evaluation 04/11/21    Authorization Type U[employer] Medicare    Progress Note Due on Visit 10    PT Start Time 0903    PT Stop Time 0943    PT Time Calculation (min) 40 min    Activity Tolerance Patient tolerated treatment well    Behavior During Therapy [facility] for tasks assessed/performed             Past Medical History:  Diagnosis Date   Hypertension     Past Surgical History:  Procedure Laterality Date   SKIN BIOPSY     TONSILLECTOMY     TUBAL LIGATION      There were no vitals filed for this visit.   Subjective Assessment - 03/07/21 0906     Subjective Patient states things are going well. Has been compliant with HEP. No pain currently. Feels she is walking a little better.    Limitations Standing;Walking;House hold activities    How long can you walk comfortably? 50 feet    Patient Stated Goals improve walking and mobility    Currently in Pain? No/denies                               [facility] Adult PT Treatment/Exercise - 03/07/21 0001       Knee/Hip Exercises: Standing   Heel Raises Both;2 sets;10 reps    Heel Raises Limitations 2 lb ankle weights    Hip Flexion Stengthening;Both;1 set;20 reps    Hip Flexion Limitations 4" step taps with 2 lb ankle weights    Hip Abduction Stengthening;Both;2 sets;10 reps    Abduction Limitations 2 lb ankle weights    Other Standing Knee Exercises toe raises; 20 reps      Knee/Hip Exercises: Seated   Long Arc Quad Both;2 sets;10 reps    Long Arc Quad Limitations 2 lb ankle weight added for  second set    [employer] pink ball; 2 sets x 10    Sit to [employer] 5 reps;with UE support   focus on eccentric lowering without UE support                      PT Short Term Goals - 02/28/21 1036       PT SHORT TERM GOAL #1   Title Patient will be independent with HEP in order to improve functional outcomes.    Time 3    Period Weeks    Status New    Target Date 03/21/21      PT SHORT TERM GOAL #2   Title Patient will report at least 25% improvement in symptoms for improved quality of life.    Time 3    Period Weeks    Status New    Target Date 03/21/21               PT Long Term Goals - 02/28/21 1037       PT LONG TERM GOAL #1   Title  Patient will report at least 75% improvement in symptoms for improved quality of life.    Time 6    Period Weeks    Status New    Target Date 04/11/21      PT LONG TERM GOAL #2   Title Patient will be able to complete 5x STS in under 14 seconds in order to reduce the risk of falls.    Time 6    Period Weeks    Status New    Target Date 04/11/21      PT LONG TERM GOAL #3   Title Patient will be able to ambulate at least 226 feet in without AD in order to demonstrate improved gait speed for community ambulation.    Time 6    Period Weeks    Status New    Target Date 04/11/21                   Plan - 03/07/21 0941     Clinical Impression Statement Patient tolerated session well. Progressed to more standing activities to increase endurance and LE strengthening. Performed sit to stands with an eccentric lowering focus for further LE strengthening. Educated patient on reducing posterior translation during standing toe raise to increase demand of anterior tib. Introduced patient to box taps to challenge dynamic balance and LE strengthening. Patient continues to require skilled therapy for LE strengthening and improve balance to return to prior level of function and reduce falls risk.    Personal Factors and  Comorbidities Fitness;Past/Current Experience;Time since onset of injury/illness/exacerbation    Examination-Activity Limitations Locomotion Level;Transfers;Squat;Stairs;Stand;Lift;Hygiene/Grooming    Examination-Participation Restrictions Cleaning;Community Activity;Shop;Volunteer;Pincus Badder [facility]    Stability/Clinical Decision Making Stable/Uncomplicated    Rehab Potential Good    PT Frequency 2x / week    PT Duration 6 weeks    PT Treatment/Interventions ADLs/Self Care Home Management;Aquatic Therapy;Cryotherapy;Electrical Stimulation;Iontophoresis 4mg /ml Dexamethasone;Moist Heat;Traction;Ultrasound;Parrafin;DME Instruction;Gait training;Stair training;Functional mobility training;Therapeutic activities;Therapeutic exercise;Balance training;Neuromuscular re-education;Patient/family education;Manual techniques;Manual lymph drainage;Compression bandaging;Scar mobilization;Passive range of motion;Dry needling;Energy conservation;Splinting;Taping;Spinal Manipulations;Joint Manipulations    PT Next Visit Plan Continue LT strength, balance and gait progressions. Tandem stance, step ups    PT Home Exercise Plan 9/27 march, LAQ, HR/TR, STS; 9/29 - seated hip iso abd/add, issued RTB 10/4 eccentric sit to stands    Consulted and Agree with Plan of Care Patient             Patient will benefit from skilled therapeutic intervention in order to improve the following deficits and impairments:  Abnormal gait, Difficulty walking, Decreased endurance, Decreased activity tolerance, Decreased balance, Impaired flexibility, Improper body mechanics, Increased edema, Decreased strength, Decreased mobility  Visit Diagnosis: Other abnormalities of gait and mobility  Muscle weakness (generalized)  Other symptoms and signs involving the musculoskeletal system     Problem List Patient Active Problem List   Diagnosis Date Noted   GERD (gastroesophageal reflux disease) 12/14/2019   Essential hypertension  07/15/2015   9:45 AM, 03/07/21 05/07/21 PT DPT  Physical Therapist with [place]  [facility]  [phone]   [facility] [address] [place], [place], [place] Phone: [phone]   Fax:  [phone]  Name: MAIRE GOVAN MRN: [name] Date of Birth: [birth year]

## 2021-03-07 NOTE — Patient Instructions (Signed)
Access Code: [email or web address] URL: [email or web address] Date: 03/07/2021 Prepared by: Georges Lynch  Exercises Sit to Stand with Counter Support - 2 x daily - 7 x weekly - 2 sets - 5 reps

## 2021-03-08 DIAGNOSIS — N390 Urinary tract infection, site not specified: Secondary | ICD-10-CM | POA: Diagnosis not present

## 2021-03-14 ENCOUNTER — Ambulatory Visit: Payer: Medicare Other | Admitting: Orthopaedic Surgery

## 2021-03-14 ENCOUNTER — Other Ambulatory Visit: Payer: Self-pay

## 2021-03-14 ENCOUNTER — Ambulatory Visit (HOSPITAL_COMMUNITY): Payer: Medicare Other

## 2021-03-14 ENCOUNTER — Encounter (HOSPITAL_COMMUNITY): Payer: Self-pay

## 2021-03-14 DIAGNOSIS — R29898 Other symptoms and signs involving the musculoskeletal system: Secondary | ICD-10-CM

## 2021-03-14 DIAGNOSIS — R2689 Other abnormalities of gait and mobility: Secondary | ICD-10-CM

## 2021-03-14 DIAGNOSIS — M6281 Muscle weakness (generalized): Secondary | ICD-10-CM

## 2021-03-14 NOTE — Therapy (Signed)
[facility] [address] [place], [place], [address] Phone: [phone]   Fax:  [phone]  Physical Therapy Treatment  Patient Details  Name: Mariah Ross MRN: [id number] Date of Birth: [birth year] Referring Provider (PT): Darreld Mclean MD   Encounter Date: 03/14/2021   PT End of Session - 03/14/21 1013     Visit Number 4    Number of Visits 12    Date for PT Re-Evaluation 04/11/21    Authorization Type UHC Medicare    Progress Note Due on Visit 10    PT Start Time 1004    PT Stop Time 1045    PT Time Calculation (min) 41 min    Activity Tolerance Patient limited by fatigue;Patient tolerated treatment well;No increased pain    Behavior During Therapy WFL for tasks assessed/performed             Past Medical History:  Diagnosis Date   Hypertension     Past Surgical History:  Procedure Laterality Date   SKIN BIOPSY     TONSILLECTOMY     TUBAL LIGATION      There were no vitals filed for this visit.   Subjective Assessment - 03/14/21 1012     Subjective Pt feels the exercises are helping.  Feels she is walking better and easier to stand without HHA.  Pt wore compression socks to dept, reports she can see the improvements wtih them on.    Patient Stated Goals improve walking and mobility    Currently in Pain? No/denies                               [facility] Adult PT Treatment/Exercise - 03/14/21 0001       Exercises   Exercises Knee/Hip      Knee/Hip Exercises: Standing   Heel Raises Both;2 sets;10 reps    Heel Raises Limitations toe raises; 20 reps on decline slope    Hip Flexion Stengthening;Both;3 sets;10 reps;Knee bent    Hip Flexion Limitations 6in toe taps alternating with less UE A each set    Hip Abduction Stengthening;10 reps;Knee straight   3" holds   Abduction Limitations 2 lb ankle weights    Forward Step Up Both;10 reps;Hand Hold: 2;Step Height: 4"    Functional Squat 2  sets;5 reps    Functional Squat Limitations front of mat for mechanics    Other Standing Knee Exercises Sidestep inside // bars 2RT with 2#    Other Standing Knee Exercises Tandem stance 16", 22", 30" with no HHA      Knee/Hip Exercises: Seated   Sit to Sand 2 sets;5 reps;without UE support   mat at 22in height with no HHA required                      PT Short Term Goals - 02/28/21 1036       PT SHORT TERM GOAL #1   Title Patient will be independent with HEP in order to improve functional outcomes.    Time 3    Period Weeks    Status New    Target Date 03/21/21      PT SHORT TERM GOAL #2   Title Patient will report at least 25% improvement in symptoms for improved quality of life.    Time 3    Period Weeks    Status New    Target Date 03/21/21  PT Long Term Goals - 02/28/21 1037       PT LONG TERM GOAL #1   Title Patient will report at least 75% improvement in symptoms for improved quality of life.    Time 6    Period Weeks    Status New    Target Date 04/11/21      PT LONG TERM GOAL #2   Title Patient will be able to complete 5x STS in under 14 seconds in order to reduce the risk of falls.    Time 6    Period Weeks    Status New    Target Date 04/11/21      PT LONG TERM GOAL #3   Title Patient will be able to ambulate at least 226 feet in without AD in order to demonstrate improved gait speed for community ambulation.    Time 6    Period Weeks    Status New    Target Date 04/11/21                   Plan - 03/14/21 1315     Clinical Impression Statement Pt progressing well towards POC.  Added functional strengthening and balance activities with good tolerance.  Pt did require periodic short duration seated rest breaks through out session due to fatigue.  Pt reports improved confidence with toe tapping activities with less UE assistance each set, min guard for safety.  No reports of pain through session.     Personal Factors and Comorbidities Fitness;Past/Current Experience;Time since onset of injury/illness/exacerbation    Examination-Participation Restrictions Cleaning;Community Activity;Shop;Volunteer;Aaron Mose    Stability/Clinical Decision Making Stable/Uncomplicated    Clinical Decision Making Low    Rehab Potential Good    PT Frequency 2x / week    PT Duration 6 weeks    PT Treatment/Interventions ADLs/Self Care Home Management;Aquatic Therapy;Cryotherapy;Electrical Stimulation;Iontophoresis 4mg /ml Dexamethasone;Moist Heat;Traction;Ultrasound;Parrafin;DME Instruction;Gait training;Stair training;Functional mobility training;Therapeutic activities;Therapeutic exercise;Balance training;Neuromuscular re-education;Patient/family education;Manual techniques;Manual lymph drainage;Compression bandaging;Scar mobilization;Passive range of motion;Dry needling;Energy conservation;Splinting;Taping;Spinal Manipulations;Joint Manipulations    PT Next Visit Plan Continue LT strength, balance and gait progressions. Add step down and increase forward step up height.    PT Home Exercise Plan 9/27 march, LAQ, HR/TR, STS; 9/29 - seated hip iso abd/add, issued RTB 10/4 eccentric sit to stands; tandem stance by counter    Consulted and Agree with Plan of Care Patient             Patient will benefit from skilled therapeutic intervention in order to improve the following deficits and impairments:  Abnormal gait, Difficulty walking, Decreased endurance, Decreased activity tolerance, Decreased balance, Impaired flexibility, Improper body mechanics, Increased edema, Decreased strength, Decreased mobility  Visit Diagnosis: Other abnormalities of gait and mobility  Muscle weakness (generalized)  Other symptoms and signs involving the musculoskeletal system     Problem List Patient Active Problem List   Diagnosis Date Noted   GERD (gastroesophageal reflux disease) 12/14/2019   Essential hypertension  07/15/2015   09/12/2015, LPTA/CLT; CBIS [phone]  [phone], PTA 03/14/2021, 1:24 PM   [facility] [address] [place], [place], [place] Phone: [phone]   Fax:  [phone]  Name: SHANICA CASTELLANOS MRN: Mariah Ross Date of Birth: Sep 01, 1945

## 2021-03-16 ENCOUNTER — Ambulatory Visit (HOSPITAL_COMMUNITY): Payer: Medicare Other

## 2021-03-16 ENCOUNTER — Encounter (HOSPITAL_COMMUNITY): Payer: Self-pay

## 2021-03-16 ENCOUNTER — Other Ambulatory Visit: Payer: Self-pay

## 2021-03-16 DIAGNOSIS — R2689 Other abnormalities of gait and mobility: Secondary | ICD-10-CM | POA: Diagnosis not present

## 2021-03-16 DIAGNOSIS — R29898 Other symptoms and signs involving the musculoskeletal system: Secondary | ICD-10-CM

## 2021-03-16 DIAGNOSIS — M6281 Muscle weakness (generalized): Secondary | ICD-10-CM

## 2021-03-16 NOTE — Therapy (Signed)
[facility] [address] [place], [place], [address] Phone: [phone]   Fax:  [phone]  Physical Therapy Treatment  Patient Details  Name: LARAYNE BAXLEY MRN: [id number] Date of Birth: [birth year] Referring Provider (PT): Darreld Mclean MD   Encounter Date: 03/16/2021   PT End of Session - 03/16/21 1009     Visit Number 5    Number of Visits 12    Date for PT Re-Evaluation 04/11/21    Authorization Type UHC Medicare    Progress Note Due on Visit 10    PT Start Time 1004    PT Stop Time 1045    PT Time Calculation (min) 41 min    Activity Tolerance Patient limited by fatigue;Patient tolerated treatment well;No increased pain    Behavior During Therapy WFL for tasks assessed/performed             Past Medical History:  Diagnosis Date   Hypertension     Past Surgical History:  Procedure Laterality Date   SKIN BIOPSY     TONSILLECTOMY     TUBAL LIGATION      There were no vitals filed for this visit.   Subjective Assessment - 03/16/21 1008     Subjective Pt stated she felt more energy following last session, reports she was fatigued though no reports of pain.  Reports increase ease with bed mobility and ability to walk faster and longer duration.  Has been compliant wiht HEP daily.    Patient Stated Goals improve walking and mobility    Currently in Pain? No/denies                               [facility] Adult PT Treatment/Exercise - 03/16/21 0001       Exercises   Exercises Knee/Hip      Knee/Hip Exercises: Standing   Heel Raises 15 reps    Heel Raises Limitations incline slope    Hip Flexion Stengthening;Both;3 sets;10 reps;Knee bent    Hip Flexion Limitations 6in toe taps alternating with less UE A each set    Forward Step Up Both;10 reps;Hand Hold: 2;Step Height: 6"    Forward Step Up Limitations Cueing to reduce heavy UE support with Rt step up    Step Down Both;10 reps;Hand Hold:  2;Step Height: 4"    Functional Squat 2 sets;5 reps    Functional Squat Limitations front of mat for mechanics    Other Standing Knee Exercises hurdles alternating forward over as well as side step x 1 min each 6in height 2 sets reduced UE assistance 2nd set      Knee/Hip Exercises: Seated   Sit to Sand without UE support;10 reps   standard height                      PT Short Term Goals - 02/28/21 1036       PT SHORT TERM GOAL #1   Title Patient will be independent with HEP in order to improve functional outcomes.    Time 3    Period Weeks    Status New    Target Date 03/21/21      PT SHORT TERM GOAL #2   Title Patient will report at least 25% improvement in symptoms for improved quality of life.    Time 3    Period Weeks    Status New    Target Date  03/21/21               PT Long Term Goals - 02/28/21 1037       PT LONG TERM GOAL #1   Title Patient will report at least 75% improvement in symptoms for improved quality of life.    Time 6    Period Weeks    Status New    Target Date 04/11/21      PT LONG TERM GOAL #2   Title Patient will be able to complete 5x STS in under 14 seconds in order to reduce the risk of falls.    Time 6    Period Weeks    Status New    Target Date 04/11/21      PT LONG TERM GOAL #3   Title Patient will be able to ambulate at least 226 feet in without AD in order to demonstrate improved gait speed for community ambulation.    Time 6    Period Weeks    Status New    Target Date 04/11/21                   Plan - 03/16/21 1203     Clinical Impression Statement Pt demonstrates improved functional abilities wiht ability to complete STS from standard height with no HHA required.  Continued functional strengthening and balalnce training this session with additional step down training and increased height wiht step ups.  Pt does continue to required HHA with funcitonal strenghtening tasks and intermittent wiht  balance activities.  Added step over training with hurdles this session, slow movements and HHA required for safety with challenge activity.  Pt was limited with fatigue wiht activities, required periodic seated rest breaks thorugh session.  No reports of pain through session.    Personal Factors and Comorbidities Fitness;Past/Current Experience;Time since onset of injury/illness/exacerbation    Examination-Activity Limitations Locomotion Level;Transfers;Squat;Stairs;Stand;Lift;Hygiene/Grooming    Examination-Participation Restrictions Cleaning;Community Activity;Shop;Volunteer;Aaron Mose    Stability/Clinical Decision Making Stable/Uncomplicated    Clinical Decision Making Low    Rehab Potential Good    PT Frequency 2x / week    PT Duration 6 weeks    PT Treatment/Interventions ADLs/Self Care Home Management;Aquatic Therapy;Cryotherapy;Electrical Stimulation;Iontophoresis 4mg /ml Dexamethasone;Moist Heat;Traction;Ultrasound;Parrafin;DME Instruction;Gait training;Stair training;Functional mobility training;Therapeutic activities;Therapeutic exercise;Balance training;Neuromuscular re-education;Patient/family education;Manual techniques;Manual lymph drainage;Compression bandaging;Scar mobilization;Passive range of motion;Dry needling;Energy conservation;Splinting;Taping;Spinal Manipulations;Joint Manipulations    PT Next Visit Plan Added forward/retro gait, functional strengthening with step up/down, gait training wiht LRAD.  Progress to SLS based activities when appropriate.    PT Home Exercise Plan 9/27 march, LAQ, HR/TR, STS; 9/29 - seated hip iso abd/add, issued RTB 10/4 eccentric sit to stands; tandem stance by counter    Consulted and Agree with Plan of Care Patient             Patient will benefit from skilled therapeutic intervention in order to improve the following deficits and impairments:  Abnormal gait, Difficulty walking, Decreased endurance, Decreased activity tolerance,  Decreased balance, Impaired flexibility, Improper body mechanics, Increased edema, Decreased strength, Decreased mobility  Visit Diagnosis: Other abnormalities of gait and mobility  Muscle weakness (generalized)  Other symptoms and signs involving the musculoskeletal system     Problem List Patient Active Problem List   Diagnosis Date Noted   GERD (gastroesophageal reflux disease) 12/14/2019   Essential hypertension 07/15/2015   09/12/2015, LPTA/CLT; CBIS [phone]  [phone], PTA 03/16/2021, 12:08 PM  [place] [facility] [address]  [address] [place], [place], [address] Phone: [phone]   Fax:  [phone]  Name: MEKALA WINGER MRN: [id number] Date of Birth: Sep 14, 1945

## 2021-03-21 ENCOUNTER — Ambulatory Visit (HOSPITAL_COMMUNITY): Payer: Medicare Other | Admitting: Physical Therapy

## 2021-03-21 ENCOUNTER — Other Ambulatory Visit: Payer: Self-pay

## 2021-03-21 DIAGNOSIS — R2689 Other abnormalities of gait and mobility: Secondary | ICD-10-CM | POA: Diagnosis not present

## 2021-03-21 DIAGNOSIS — R29898 Other symptoms and signs involving the musculoskeletal system: Secondary | ICD-10-CM

## 2021-03-21 DIAGNOSIS — M6281 Muscle weakness (generalized): Secondary | ICD-10-CM

## 2021-03-21 NOTE — Therapy (Signed)
[facility] [address] [place], [place], [address] Phone: [phone]   Fax:  [phone]  Physical Therapy Treatment  Patient Details  Name: Mariah Ross MRN: [id number] Date of Birth: 11/27/1945 Referring Provider (PT): Darreld Mclean MD   Encounter Date: 03/21/2021   PT End of Session - 03/21/21 1034     Visit Number 6    Number of Visits 12    Date for PT Re-Evaluation 04/11/21    Authorization Type UHC Medicare    Progress Note Due on Visit 10    PT Start Time 1000    PT Stop Time 1043    PT Time Calculation (min) 43 min    Activity Tolerance Patient limited by fatigue;Patient tolerated treatment well;No increased pain    Behavior During Therapy WFL for tasks assessed/performed             Past Medical History:  Diagnosis Date   Hypertension     Past Surgical History:  Procedure Laterality Date   SKIN BIOPSY     TONSILLECTOMY     TUBAL LIGATION      There were no vitals filed for this visit.   Subjective Assessment - 03/21/21 1034     Subjective pt states she is doing well today.  No reports of pain or issues.    Currently in Pain? No/denies                               [facility] Adult PT Treatment/Exercise - 03/21/21 0001       Knee/Hip Exercises: Standing   Heel Raises 15 reps    Heel Raises Limitations incline slope    Hip Flexion Stengthening;Both;3 sets;10 reps;Knee bent    Hip Flexion Limitations 6in toe taps alternating with less UE A each set    Lateral Step Up Both;10 reps;Hand Hold: 2;Step Height: 6"    Forward Step Up Both;10 reps;Hand Hold: 2;Step Height: 6"    Forward Step Up Limitations Cueing to reduce heavy UE support with Rt step up    Step Down Both;10 reps;Hand Hold: 2;Step Height: 4"    Functional Squat 10 reps    Functional Squat Limitations front of mat for mechanics    SLS with Vectors 5 X 3" each with bil UE assist      Knee/Hip Exercises: Seated   Sit  to Sand without UE support;10 reps                       PT Short Term Goals - 02/28/21 1036       PT SHORT TERM GOAL #1   Title Patient will be independent with HEP in order to improve functional outcomes.    Time 3    Period Weeks    Status New    Target Date 03/21/21      PT SHORT TERM GOAL #2   Title Patient will report at least 25% improvement in symptoms for improved quality of life.    Time 3    Period Weeks    Status New    Target Date 03/21/21               PT Long Term Goals - 02/28/21 1037       PT LONG TERM GOAL #1   Title Patient will report at least 75% improvement in symptoms for improved quality of life.  Time 6    Period Weeks    Status New    Target Date 04/11/21      PT LONG TERM GOAL #2   Title Patient will be able to complete 5x STS in under 14 seconds in order to reduce the risk of falls.    Time 6    Period Weeks    Status New    Target Date 04/11/21      PT LONG TERM GOAL #3   Title Patient will be able to ambulate at least 226 feet in without AD in order to demonstrate improved gait speed for community ambulation.    Time 6    Period Weeks    Status New    Target Date 04/11/21                   Plan - 03/21/21 1044     Clinical Impression Statement Continued with standing strengthening and stability challenges. Pt requires max verbal and some tactile cues to complete therex in correct form and with increased hold times.  Pt also with difficulty maintaining upright posturing without cues.    Added vector stance with noted lateral and posterior challenges and cues to hold each stance longer while maintaining form.  Pt requires frequent rest breaks during session due to fatigue.  Pt reports she can tell she is getting stronger with each visit.    Personal Factors and Comorbidities Fitness;Past/Current Experience;Time since onset of injury/illness/exacerbation    Examination-Activity Limitations Locomotion  Level;Transfers;Squat;Stairs;Stand;Lift;Hygiene/Grooming    Examination-Participation Restrictions Cleaning;Community Activity;Shop;Volunteer;Pincus Badder [facility]    Stability/Clinical Decision Making Stable/Uncomplicated    Rehab Potential Good    PT Frequency 2x / week    PT Duration 6 weeks    PT Treatment/Interventions ADLs/Self Care Home Management;Aquatic Therapy;Cryotherapy;Electrical Stimulation;Iontophoresis 4mg /ml Dexamethasone;Moist Heat;Traction;Ultrasound;Parrafin;DME Instruction;Gait training;Stair training;Functional mobility training;Therapeutic activities;Therapeutic exercise;Balance training;Neuromuscular re-education;Patient/family education;Manual techniques;Manual lymph drainage;Compression bandaging;Scar mobilization;Passive range of motion;Dry needling;Energy conservation;Splinting;Taping;Spinal Manipulations;Joint Manipulations    PT Next Visit Plan Next session Add side stepping and retro gait in bars.  Begin static balance tasks including tandem stance.  Progress functional strengthening and gait training with LRAD.    PT Home Exercise Plan 9/27 march, LAQ, HR/TR, STS; 9/29 - seated hip iso abd/add, issued RTB 10/4 eccentric sit to stands; tandem stance by counter    Consulted and Agree with Plan of Care Patient             Patient will benefit from skilled therapeutic intervention in order to improve the following deficits and impairments:  Abnormal gait, Difficulty walking, Decreased endurance, Decreased activity tolerance, Decreased balance, Impaired flexibility, Improper body mechanics, Increased edema, Decreased strength, Decreased mobility  Visit Diagnosis: Other abnormalities of gait and mobility  Muscle weakness (generalized)  Other symptoms and signs involving the musculoskeletal system     Problem List Patient Active Problem List   Diagnosis Date Noted   GERD (gastroesophageal reflux disease) 12/14/2019   Essential hypertension 07/15/2015   09/12/2015, PTA/CLT, WTA [phone]  [phone], PTA 03/21/2021, 10:46 AM  [facility] [address] [place], [place], [place] Phone: [phone]   Fax:  [phone]  Name: Mariah Ross MRN: [name] Date of Birth: [birth year]

## 2021-03-23 ENCOUNTER — Other Ambulatory Visit: Payer: Self-pay

## 2021-03-23 ENCOUNTER — Ambulatory Visit (HOSPITAL_COMMUNITY): Payer: Medicare Other

## 2021-03-23 ENCOUNTER — Encounter (HOSPITAL_COMMUNITY): Payer: Self-pay

## 2021-03-23 DIAGNOSIS — M6281 Muscle weakness (generalized): Secondary | ICD-10-CM | POA: Diagnosis not present

## 2021-03-23 DIAGNOSIS — R29898 Other symptoms and signs involving the musculoskeletal system: Secondary | ICD-10-CM | POA: Diagnosis not present

## 2021-03-23 DIAGNOSIS — R2689 Other abnormalities of gait and mobility: Secondary | ICD-10-CM | POA: Diagnosis not present

## 2021-03-23 NOTE — Therapy (Signed)
[facility] [address] [place], [place], [address] Phone: [phone]   Fax:  [phone]  Physical Therapy Treatment  Patient Details  Name: Mariah Ross MRN: [id number] Date of Birth: Feb 05, 1946 Referring Provider (PT): Darreld Mclean MD   Encounter Date: 03/23/2021   PT End of Session - 03/23/21 1018     Visit Number 7    Number of Visits 12    Date for PT Re-Evaluation 04/11/21    Authorization Type UHC Medicare    Progress Note Due on Visit 10    PT Start Time 1019    PT Stop Time 1100    PT Time Calculation (min) 41 min    Activity Tolerance Patient limited by fatigue;Patient tolerated treatment well;No increased pain    Behavior During Therapy WFL for tasks assessed/performed             Past Medical History:  Diagnosis Date   Hypertension     Past Surgical History:  Procedure Laterality Date   SKIN BIOPSY     TONSILLECTOMY     TUBAL LIGATION      There were no vitals filed for this visit.   Subjective Assessment - 03/23/21 1026     Subjective Patient reoprts she is so much better than when she started PT considering she wasn't able to walk at first. She has been doing a little walking in her home without her rolling walker.    Currently in Pain? No/denies              [facility] Adult PT Treatment/Exercise - 03/23/21 0001       Ambulation/Gait   Ambulation/Gait Yes    Ambulation/Gait Assistance 5: Supervision;4: Min guard    Ambulation/Gait Assistance Details quad cane, equalize step length, sequencing of steps    Ambulation Distance (Feet) 226 Feet    Assistive device Large base quad cane    Ambulation Surface Level;Indoor      Knee/Hip Exercises: Standing   Heel Raises 15 reps    Heel Raises Limitations incline slope    Hip Flexion Stengthening;Both;3 sets;10 reps;Knee bent    Hip Flexion Limitations 6in toe taps alternating with graded UE assist x10, 2 sets    Other Standing Knee Exercises  tandem 30 sec x2 each    Other Standing Knee Exercises side stepping on blue line 2RT      Knee/Hip Exercises: Seated   Sit to Sand without UE support;10 reps                     PT Education - 03/23/21 1241     Education Details HEP    Person(s) Educated Patient    Methods Explanation;Handout    Comprehension Verbalized understanding              PT Short Term Goals - 03/23/21 1018       PT SHORT TERM GOAL #1   Title Patient will be independent with HEP in order to improve functional outcomes.    Time 3    Period Weeks    Status Achieved    Target Date 03/21/21      PT SHORT TERM GOAL #2   Title Patient will report at least 25% improvement in symptoms for improved quality of life.    Baseline 03/23/2021 - Patient reports 100% better as she is able to walk a little without her rolling walker now.    Time 3    Period  Weeks    Status Achieved    Target Date 03/21/21               PT Long Term Goals - 03/23/21 1018       PT LONG TERM GOAL #1   Title Patient will report at least 75% improvement in symptoms for improved quality of life.    Baseline 03/23/2021 - Patient reports 100% better as she is able to walk a little without her rolling walker now.    Time 6    Period Weeks    Status Achieved      PT LONG TERM GOAL #2   Title Patient will be able to complete 5x STS in under 14 seconds in order to reduce the risk of falls.    Time 6    Period Weeks    Status On-going      PT LONG TERM GOAL #3   Title Patient will be able to ambulate at least 226 feet in without AD in order to demonstrate improved gait speed for community ambulation.    Baseline 03/23/21 - ambulated 226 feet with quad cane and contact guard assist to supervision with cues for sequencing of steps and equalize step length    Time 6    Period Weeks    Status On-going                   Plan - 03/23/21 1018     Clinical Impression Statement Session focused on  weight bearing activities for lower extremity strengthening, balance and gait training with wide base quad cane line what patient has at home. Patient ambulated 226 feet with quad cane slowly with contact guard assist to supervision with cues for sequencing of steps and equalize step length. Patient performed therapeutic exercises with rest breaks as needed. Added side stepping and tandem stance balance to therapeutic exercises. Added tandem stance balance to home exercise program. Patient able to perform tandem stance balance for 30 seconds with minimal intermittent UE assist.    Personal Factors and Comorbidities Fitness;Past/Current Experience;Time since onset of injury/illness/exacerbation    Examination-Activity Limitations Locomotion Level;Transfers;Squat;Stairs;Stand;Lift;Hygiene/Grooming    Examination-Participation Restrictions Cleaning;Community Activity;Shop;Volunteer;Pincus Badder [employer]    Stability/Clinical Decision Making Stable/Uncomplicated    Rehab Potential Good    PT Frequency 2x / week    PT Duration 6 weeks    PT Treatment/Interventions ADLs/Self Care Home Management;Aquatic Therapy;Cryotherapy;Electrical Stimulation;Iontophoresis 4mg /ml Dexamethasone;Moist Heat;Traction;Ultrasound;Parrafin;DME Instruction;Gait training;Stair training;Functional mobility training;Therapeutic activities;Therapeutic exercise;Balance training;Neuromuscular re-education;Patient/family education;Manual techniques;Manual lymph drainage;Compression bandaging;Scar mobilization;Passive range of motion;Dry needling;Energy conservation;Splinting;Taping;Spinal Manipulations;Joint Manipulations    PT Next Visit Plan Next session Add side stepping and retro gait in bars.  Begin static balance tasks including tandem stance.  Progress functional strengthening and gait training with LRAD.    PT Home Exercise Plan 9/27 march, LAQ, HR/TR, STS; 9/29 - seated hip iso abd/add, issued RTB 10/4 eccentric sit to stands; tandem  stance by counter    Consulted and Agree with Plan of Care Patient             Patient will benefit from skilled therapeutic intervention in order to improve the following deficits and impairments:  Abnormal gait, Difficulty walking, Decreased endurance, Decreased activity tolerance, Decreased balance, Impaired flexibility, Improper body mechanics, Increased edema, Decreased strength, Decreased mobility  Visit Diagnosis: Other abnormalities of gait and mobility  Muscle weakness (generalized)  Other symptoms and signs involving the musculoskeletal system     Problem List Patient Active Problem List  Diagnosis Date Noted   GERD (gastroesophageal reflux disease) 12/14/2019   Essential hypertension 07/15/2015   Katina Dung. Hartnett-Rands, MS, PT [place] [facility] [place] [phone]  Epifanio Lesches, PT 03/23/2021, 12:42 PM  [facility] [address] [place], [place], [address] Phone: [phone]   Fax:  [phone]  Name: Mariah Ross MRN: [id number] Date of Birth: [birth year]

## 2021-03-23 NOTE — Patient Instructions (Signed)
Tandem Stance    Right foot in front of left, heel touching toe both feet "straight ahead". Stand on Foot Triangle of Support with both feet. Balance in this position 30 seconds. Do with left foot in front of right. Repeat 2-3 sets with each foot forward.  Copyright  VHI. All rights reserved.   Semi Tandem Standing    Heel of one foot against arch of other: 1.Look right _5__ times. 2. Look left _5__ times. 3. Look up __5_ times. 4. With right arm, reach right, left, forward and back. Repeat _2___ times. Do _1___ sessions per day.  http://[email or web address].exer.us/540   Copyright  VHI. All rights reserved.

## 2021-03-27 ENCOUNTER — Ambulatory Visit (INDEPENDENT_AMBULATORY_CARE_PROVIDER_SITE_OTHER): Payer: Medicare Other | Admitting: Gastroenterology

## 2021-03-28 ENCOUNTER — Other Ambulatory Visit: Payer: Self-pay

## 2021-03-28 ENCOUNTER — Ambulatory Visit (HOSPITAL_COMMUNITY): Payer: Medicare Other

## 2021-03-28 DIAGNOSIS — R29898 Other symptoms and signs involving the musculoskeletal system: Secondary | ICD-10-CM | POA: Diagnosis not present

## 2021-03-28 DIAGNOSIS — R2689 Other abnormalities of gait and mobility: Secondary | ICD-10-CM | POA: Diagnosis not present

## 2021-03-28 DIAGNOSIS — M6281 Muscle weakness (generalized): Secondary | ICD-10-CM

## 2021-03-28 NOTE — Therapy (Signed)
[facility] [address] [place], [place], [address] Phone: [phone]   Fax:  [phone]  Physical Therapy Treatment  Patient Details  Name: Mariah Ross MRN: [id number] Date of Birth: 05/26/1946 Referring Provider (PT): Darreld Mclean MD   Encounter Date: 03/28/2021   PT End of Session - 03/28/21 0956     Visit Number 8    Number of Visits 12    Date for PT Re-Evaluation 04/11/21    Authorization Type UHC Medicare    Progress Note Due on Visit 10    PT Start Time 0945    PT Stop Time 1030    PT Time Calculation (min) 45 min    Activity Tolerance Patient limited by fatigue;Patient tolerated treatment well;No increased pain    Behavior During Therapy WFL for tasks assessed/performed             Past Medical History:  Diagnosis Date   Hypertension     Past Surgical History:  Procedure Laterality Date   SKIN BIOPSY     TONSILLECTOMY     TUBAL LIGATION      There were no vitals filed for this visit.   Subjective Assessment - 03/28/21 0955     Subjective "Continued right knee discomfort when walking, no falls and not too painful"    Currently in Pain? No/denies                               [facility] Adult PT Treatment/Exercise - 03/28/21 0001       Knee/Hip Exercises: Standing   Heel Raises Both;3 sets;10 reps    Hip Flexion Stengthening;Both;3 sets;10 reps;Knee bent    Hip Flexion Limitations 6in toe taps alternating with graded UE assist    Other Standing Knee Exercises tandem stance on foam 3x15 sec left/right. Static standing eyes closed on airex pad 3x15 sec. to improve proprioception    Other Standing Knee Exercises side stepping on blue line 3# ankle weights to improve single limb weight acceptance and dynamic balance      Knee/Hip Exercises: Seated   Long Arc Quad Strengthening;Both;3 sets;10 reps    Long Arc Quad Weight 3 lbs.    Ball Squeeze pink ball; 2 sets x 10    Other Seated  Knee/Hip Exercises heel/toe raises x 20                       PT Short Term Goals - 03/23/21 1018       PT SHORT TERM GOAL #1   Title Patient will be independent with HEP in order to improve functional outcomes.    Time 3    Period Weeks    Status Achieved    Target Date 03/21/21      PT SHORT TERM GOAL #2   Title Patient will report at least 25% improvement in symptoms for improved quality of life.    Baseline 03/23/2021 - Patient reports 100% better as she is able to walk a little without her rolling walker now.    Time 3    Period Weeks    Status Achieved    Target Date 03/21/21               PT Long Term Goals - 03/23/21 1018       PT LONG TERM GOAL #1   Title Patient will report at least 75% improvement in symptoms  for improved quality of life.    Baseline 03/23/2021 - Patient reports 100% better as she is able to walk a little without her rolling walker now.    Time 6    Period Weeks    Status Achieved      PT LONG TERM GOAL #2   Title Patient will be able to complete 5x STS in under 14 seconds in order to reduce the risk of falls.    Time 6    Period Weeks    Status On-going      PT LONG TERM GOAL #3   Title Patient will be able to ambulate at least 226 feet in without AD in order to demonstrate improved gait speed for community ambulation.    Baseline 03/23/21 - ambulated 226 feet with quad cane and contact guard assist to supervision with cues for sequencing of steps and equalize step length    Time 6    Period Weeks    Status On-going                   Plan - 03/28/21 1024     Clinical Impression Statement Demonstrating improved status and mobility tolerating resisted activities with 3 lbs ankle weights and increased time spent in standing during activities and improved balance performance evident on compliant surfaces. Continued sessions indicated to progress BLE strength and dynamic balance to imrpove mobility and reduce  risk for falls. Able to demo Fair+ dynamic standing balance during session    Personal Factors and Comorbidities Fitness;Past/Current Experience;Time since onset of injury/illness/exacerbation    Examination-Activity Limitations Locomotion Level;Transfers;Squat;Stairs;Stand;Lift;Hygiene/Grooming    Examination-Participation Restrictions Cleaning;Community Activity;Shop;Volunteer;Pincus Badder [facility]    Stability/Clinical Decision Making Stable/Uncomplicated    Rehab Potential Good    PT Frequency 2x / week    PT Duration 6 weeks    PT Treatment/Interventions ADLs/Self Care Home Management;Aquatic Therapy;Cryotherapy;Electrical Stimulation;Iontophoresis 4mg /ml Dexamethasone;Moist Heat;Traction;Ultrasound;Parrafin;DME Instruction;Gait training;Stair training;Functional mobility training;Therapeutic activities;Therapeutic exercise;Balance training;Neuromuscular re-education;Patient/family education;Manual techniques;Manual lymph drainage;Compression bandaging;Scar mobilization;Passive range of motion;Dry needling;Energy conservation;Splinting;Taping;Spinal Manipulations;Joint Manipulations    PT Next Visit Plan Next session Add side stepping and retro gait in bars.  Begin static balance tasks including tandem stance.  Progress functional strengthening and gait training with LRAD.    PT Home Exercise Plan 9/27 march, LAQ, HR/TR, STS; 9/29 - seated hip iso abd/add, issued RTB 10/4 eccentric sit to stands; tandem stance by counter    Consulted and Agree with Plan of Care Patient             Patient will benefit from skilled therapeutic intervention in order to improve the following deficits and impairments:  Abnormal gait, Difficulty walking, Decreased endurance, Decreased activity tolerance, Decreased balance, Impaired flexibility, Improper body mechanics, Increased edema, Decreased strength, Decreased mobility  Visit Diagnosis: Other abnormalities of gait and mobility  Muscle weakness  (generalized)  Other symptoms and signs involving the musculoskeletal system     Problem List Patient Active Problem List   Diagnosis Date Noted   GERD (gastroesophageal reflux disease) 12/14/2019   Essential hypertension 07/15/2015    09/12/2015, PT 03/28/2021, 10:30 AM  [facility] [address] [place], [place], [place] Phone: [phone]   Fax:  [phone]  Name: Mariah Ross MRN: [name] Date of Birth: [birth year]

## 2021-03-30 ENCOUNTER — Other Ambulatory Visit: Payer: Self-pay

## 2021-03-30 ENCOUNTER — Encounter (HOSPITAL_COMMUNITY): Payer: Medicare Other | Admitting: Physical Therapy

## 2021-03-30 ENCOUNTER — Ambulatory Visit (HOSPITAL_COMMUNITY): Payer: Medicare Other | Admitting: Physical Therapy

## 2021-03-30 DIAGNOSIS — R2689 Other abnormalities of gait and mobility: Secondary | ICD-10-CM

## 2021-03-30 DIAGNOSIS — R29898 Other symptoms and signs involving the musculoskeletal system: Secondary | ICD-10-CM

## 2021-03-30 DIAGNOSIS — M6281 Muscle weakness (generalized): Secondary | ICD-10-CM | POA: Diagnosis not present

## 2021-03-30 NOTE — Therapy (Signed)
[facility] [address] [place], [place], [address] Phone: [phone]   Fax:  [phone]  Physical Therapy Treatment  Patient Details  Name: Mariah Ross MRN: [id number] Date of Birth: [birth year] Referring Provider (PT): Darreld Mclean MD   Encounter Date: 03/30/2021   PT End of Session - 03/30/21 1045     Visit Number 9    Number of Visits 12    Date for PT Re-Evaluation 04/11/21    Authorization Type UHC Medicare    Progress Note Due on Visit 10    PT Start Time 1005    PT Stop Time 1045    PT Time Calculation (min) 40 min    Activity Tolerance Patient limited by fatigue;Patient tolerated treatment well;No increased pain    Behavior During Therapy WFL for tasks assessed/performed             Past Medical History:  Diagnosis Date   Hypertension     Past Surgical History:  Procedure Laterality Date   SKIN BIOPSY     TONSILLECTOMY     TUBAL LIGATION      There were no vitals filed for this visit.   Subjective Assessment - 03/30/21 1001     Subjective pt states she wants to get rid of her walker.  STates she is having no pain currenlty.                               [facility] Adult PT Treatment/Exercise - 03/30/21 0001       Knee/Hip Exercises: Standing   Heel Raises Both;3 sets;10 reps    Hip Flexion Stengthening;Both;3 sets;10 reps;Knee bent    Hip Flexion Limitations 6in toe taps alternating with graded UE assist    Lateral Step Up Both;10 reps;Hand Hold: 1;Step Height: 4";20 reps    Forward Step Up Both;20 reps;Hand Hold: 1;Step Height: 4"    Forward Step Up Limitations use of opposite UE with task    Step Down Both;10 reps;Hand Hold: 2;Step Height: 4"      Knee/Hip Exercises: Seated   Sit to Sand without UE support;10 reps                       PT Short Term Goals - 03/23/21 1018       PT SHORT TERM GOAL #1   Title Patient will be independent with HEP in order to  improve functional outcomes.    Time 3    Period Weeks    Status Achieved    Target Date 03/21/21      PT SHORT TERM GOAL #2   Title Patient will report at least 25% improvement in symptoms for improved quality of life.    Baseline 03/23/2021 - Patient reports 100% better as she is able to walk a little without her rolling walker now.    Time 3    Period Weeks    Status Achieved    Target Date 03/21/21               PT Long Term Goals - 03/23/21 1018       PT LONG TERM GOAL #1   Title Patient will report at least 75% improvement in symptoms for improved quality of life.    Baseline 03/23/2021 - Patient reports 100% better as she is able to walk a little without her rolling walker now.    Time  6    Period Weeks    Status Achieved      PT LONG TERM GOAL #2   Title Patient will be able to complete 5x STS in under 14 seconds in order to reduce the risk of falls.    Time 6    Period Weeks    Status On-going      PT LONG TERM GOAL #3   Title Patient will be able to ambulate at least 226 feet in without AD in order to demonstrate improved gait speed for community ambulation.    Baseline 03/23/21 - ambulated 226 feet with quad cane and contact guard assist to supervision with cues for sequencing of steps and equalize step length    Time 6    Period Weeks    Status On-going                   Plan - 03/30/21 1045     Clinical Impression Statement Began session with gait using SPC.  Pt trained with Lt UE as reports Rt LE is weaker.  Able to complete full revolution around gym (226 feet) but with noted fatigue and beginning to walk with some antalgia last 25 feet.  Pt alternated UE assist with alternating LE toe tapping.  Cues for posturing and to complete therex more slowly/controlled.  Pt tends to return to forward bent posturing will all activities when becomes fatigued.    Personal Factors and Comorbidities Fitness;Past/Current Experience;Time since onset of  injury/illness/exacerbation    Examination-Activity Limitations Locomotion Level;Transfers;Squat;Stairs;Stand;Lift;Hygiene/Grooming    Examination-Participation Restrictions Cleaning;Community Activity;Shop;Volunteer;Pincus Badder [facility]    Stability/Clinical Decision Making Stable/Uncomplicated    Rehab Potential Good    PT Frequency 2x / week    PT Duration 6 weeks    PT Treatment/Interventions ADLs/Self Care Home Management;Aquatic Therapy;Cryotherapy;Electrical Stimulation;Iontophoresis 4mg /ml Dexamethasone;Moist Heat;Traction;Ultrasound;Parrafin;DME Instruction;Gait training;Stair training;Functional mobility training;Therapeutic activities;Therapeutic exercise;Balance training;Neuromuscular re-education;Patient/family education;Manual techniques;Manual lymph drainage;Compression bandaging;Scar mobilization;Passive range of motion;Dry needling;Energy conservation;Splinting;Taping;Spinal Manipulations;Joint Manipulations    PT Next Visit Plan Next session Add side stepping and retro gait in bars.  Progress static balance tasks including tandem stance.  Progress functional strengthening and gait training with LRAD.  Complete 10th visit PN    PT Home Exercise Plan 9/27 march, LAQ, HR/TR, STS; 9/29 - seated hip iso abd/add, issued RTB 10/4 eccentric sit to stands; tandem stance by counter    Consulted and Agree with Plan of Care Patient             Patient will benefit from skilled therapeutic intervention in order to improve the following deficits and impairments:  Abnormal gait, Difficulty walking, Decreased endurance, Decreased activity tolerance, Decreased balance, Impaired flexibility, Improper body mechanics, Increased edema, Decreased strength, Decreased mobility  Visit Diagnosis: Other abnormalities of gait and mobility  Other symptoms and signs involving the musculoskeletal system     Problem List Patient Active Problem List   Diagnosis Date Noted   GERD (gastroesophageal reflux  disease) 12/14/2019   Essential hypertension 07/15/2015   09/12/2015, PTA/CLT, WTA [phone]  [phone], PTA 03/30/2021, 10:46 AM  [facility] [address] [place], [place], [place] Phone: [phone]   Fax:  [phone]  Name: Mariah Ross MRN: [name] Date of Birth: 1945-12-31

## 2021-04-04 ENCOUNTER — Encounter (HOSPITAL_COMMUNITY): Payer: Self-pay | Admitting: Physical Therapy

## 2021-04-04 ENCOUNTER — Ambulatory Visit (HOSPITAL_COMMUNITY): Payer: Medicare Other | Attending: Orthopaedic Surgery | Admitting: Physical Therapy

## 2021-04-04 ENCOUNTER — Other Ambulatory Visit: Payer: Self-pay

## 2021-04-04 DIAGNOSIS — R29898 Other symptoms and signs involving the musculoskeletal system: Secondary | ICD-10-CM

## 2021-04-04 DIAGNOSIS — M6281 Muscle weakness (generalized): Secondary | ICD-10-CM

## 2021-04-04 DIAGNOSIS — R2689 Other abnormalities of gait and mobility: Secondary | ICD-10-CM

## 2021-04-04 NOTE — Therapy (Signed)
[place], [place], [address] Phone: [phone]   Fax:  [phone]  Physical Therapy Treatment/Progress Note  Patient Details  Name: Mariah Ross MRN: [id number] Date of Birth: 30-Nov-1945 Referring Provider (PT): Sanjuana Kava MD   Encounter Date: 04/04/2021  Progress Note   Reporting Period 02/28/21 to 04/04/21   See note below for Objective Data and Assessment of Progress/Goals    PT End of Session - 04/04/21 1003     Visit Number 10    Number of Visits 12    Date for PT Re-Evaluation 04/11/21    Authorization Type UHC Medicare    Progress Note Due on Visit 20    PT Start Time 1003    PT Stop Time 1042    PT Time Calculation (min) 39 min    Activity Tolerance Patient limited by fatigue;Patient tolerated treatment well;No increased pain    Behavior During Therapy WFL for tasks assessed/performed             Past Medical History:  Diagnosis Date   Hypertension     Past Surgical History:  Procedure Laterality Date   SKIN BIOPSY     TONSILLECTOMY     TUBAL LIGATION      There were no vitals filed for this visit.   Subjective Assessment - 04/04/21 1004     Subjective Her home exercises are going alright. Patient states 90% improvement since beginning therapy. She remains limited with still needing AD with walking.    Currently in Pain? No/denies                [facility] PT Assessment - 04/04/21 0001       Assessment   Medical Diagnosis Bilateral foot and ankle pain; weakness    Referring Provider (PT) Sanjuana Kava MD    Onset Date/Surgical Date 01/26/21    Prior Therapy none      Precautions   Precautions Fall      Restrictions   Weight Bearing Restrictions No      Balance Screen   Has the patient fallen in the past 6 months No    Has the patient had a decrease in activity level because of a fear of falling?  No    Is the patient reluctant to leave their home because of a  fear of falling?  No      Prior Function   Level of Independence Independent    Vocation Retired      Charity fundraiser Status Within Functional Limits for tasks assessed      Observation/Other Assessments   Observations ambulates with RW    Focus on Therapeutic Outcomes (FOTO)  n/a - gait and balance      Strength   Right Hip Flexion 4+/5    Left Hip Flexion 4+/5    Right Knee Flexion 5/5    Right Knee Extension 5/5    Left Knee Flexion 5/5    Left Knee Extension 5/5      Transfers   Five time sit to stand comments  22.78 without UE use      Ambulation/Gait   Ambulation/Gait Yes    Ambulation/Gait Assistance 5: Supervision;4: Min guard    Ambulation Distance (Feet) 216 Feet    Assistive device None    Gait Pattern Trunk flexed    Ambulation Surface Level;Indoor    Gait Comments 2MWT; slow, flexed pattern gait  [facility] Adult PT Treatment/Exercise - 04/04/21 0001       Knee/Hip Exercises: Standing   Hip Flexion Stengthening;Both;3 sets;10 reps;Knee bent    Hip Flexion Limitations 6in toe taps alternating with graded UE assist    Hip Abduction Both;2 sets;10 reps    Forward Step Up Both;2 sets;10 reps;Hand Hold: 2;Step Height: 6"    Other Standing Knee Exercises tandem stance on foam 3x15 sec left/right.    Other Standing Knee Exercises lateral stepping 5x 10 feet bilateral in parallel bars                     PT Education - 04/04/21 1003     Education Details HEP    Person(s) Educated Patient    Methods Explanation;Demonstration    Comprehension Verbalized understanding;Returned demonstration              PT Short Term Goals - 03/23/21 1018       PT SHORT TERM GOAL #1   Title Patient will be independent with HEP in order to improve functional outcomes.    Time 3    Period Weeks    Status Achieved    Target Date 03/21/21      PT SHORT TERM GOAL #2   Title Patient will report at least  25% improvement in symptoms for improved quality of life.    Baseline 03/23/2021 - Patient reports 100% better as she is able to walk a little without her rolling walker now.    Time 3    Period Weeks    Status Achieved    Target Date 03/21/21               PT Long Term Goals - 04/04/21 1019       PT LONG TERM GOAL #1   Title Patient will report at least 75% improvement in symptoms for improved quality of life.    Baseline 03/23/2021 - Patient reports 100% better as she is able to walk a little without her rolling walker now.    Time 6    Period Weeks    Status Achieved      PT LONG TERM GOAL #2   Title Patient will be able to complete 5x STS in under 14 seconds in order to reduce the risk of falls.    Time 6    Period Weeks    Status On-going      PT LONG TERM GOAL #3   Title Patient will be able to ambulate at least 226 feet in 2MWT without AD in order to demonstrate improved gait speed for community ambulation.    Baseline 03/23/21 - ambulated 226 feet with quad cane and contact guard assist to supervision with cues for sequencing of steps and equalize step length 11/1 216 feet without AD    Time 6    Period Weeks    Status On-going                   Plan - 04/04/21 1003     Clinical Impression Statement Patient has met 2/2 short term goals and 1/3 long term goals with ability to complete HEP and improvement in symptoms. She continues to remain limited by decreased ambulation ability, endurance, and functional strength leading to remaining goals not met at this time. Patient demonstrating improvement in strength, gait, balance compared to initial evaluation. Continues to ambulate with trunk flexed without AD but does show improving dynamic balance. She continues to require at  least intermittent HHA for balance with standing exercises. Patient will continue to benefit from skilled physical therapy in order to reduce impairment and improve function.    Personal  Factors and Comorbidities Fitness;Past/Current Experience;Time since onset of injury/illness/exacerbation    Examination-Activity Limitations Locomotion Level;Transfers;Squat;Stairs;Stand;Lift;Hygiene/Grooming    Examination-Participation Restrictions Cleaning;Community Activity;Shop;Volunteer;[facility]    Stability/Clinical Decision Making Stable/Uncomplicated    Rehab Potential Good    PT Frequency 2x / week    PT Duration 6 weeks    PT Treatment/Interventions ADLs/Self Care Home Management;Aquatic Therapy;Cryotherapy;Electrical Stimulation;Iontophoresis 61m/ml Dexamethasone;Moist Heat;Traction;Ultrasound;Parrafin;DME Instruction;Gait training;Stair training;Functional mobility training;Therapeutic activities;Therapeutic exercise;Balance training;Neuromuscular re-education;Patient/family education;Manual techniques;Manual lymph drainage;Compression bandaging;Scar mobilization;Passive range of motion;Dry needling;Energy conservation;Splinting;Taping;Spinal Manipulations;Joint Manipulations    PT Next Visit Plan Progress static balance tasks including tandem stance.  Progress functional strengthening and gait training with LRAD.    PT Home Exercise Plan 9/27 march, LAQ, HR/TR, STS; 9/29 - seated hip iso abd/add, issued RTB 10/4 eccentric sit to stands; tandem stance by counter    Consulted and Agree with Plan of Care Patient             Patient will benefit from skilled therapeutic intervention in order to improve the following deficits and impairments:  Abnormal gait, Difficulty walking, Decreased endurance, Decreased activity tolerance, Decreased balance, Impaired flexibility, Improper body mechanics, Increased edema, Decreased strength, Decreased mobility  Visit Diagnosis: Other abnormalities of gait and mobility  Other symptoms and signs involving the musculoskeletal system  Muscle weakness (generalized)     Problem List Patient Active Problem List   Diagnosis Date Noted    GERD (gastroesophageal reflux disease) 12/14/2019   Essential hypertension 07/15/2015    10:49 AM, 04/04/21 AMearl LatinPT, DPT [place] [address]Phone: [phone]  Fax:  [phone] Name: HCLEVELAND PAIZMRN: [id number] of Birth: 1February 05, 1947

## 2021-04-06 ENCOUNTER — Ambulatory Visit (HOSPITAL_COMMUNITY): Payer: Medicare Other | Admitting: Physical Therapy

## 2021-04-06 ENCOUNTER — Other Ambulatory Visit: Payer: Self-pay

## 2021-04-06 ENCOUNTER — Encounter (HOSPITAL_COMMUNITY): Payer: Self-pay | Admitting: Physical Therapy

## 2021-04-06 DIAGNOSIS — M6281 Muscle weakness (generalized): Secondary | ICD-10-CM

## 2021-04-06 DIAGNOSIS — R29898 Other symptoms and signs involving the musculoskeletal system: Secondary | ICD-10-CM | POA: Diagnosis not present

## 2021-04-06 DIAGNOSIS — R2689 Other abnormalities of gait and mobility: Secondary | ICD-10-CM | POA: Diagnosis not present

## 2021-04-06 NOTE — Therapy (Signed)
[facility] [address] [place], [place], [address] Phone: [phone]   Fax:  [phone]  Physical Therapy Treatment  Patient Details  Name: Mariah Ross MRN: [id number] Date of Birth: 10/10/1945 Referring Provider (PT): Darreld Mclean MD   Encounter Date: 04/06/2021   PT End of Session - 04/06/21 1004     Visit Number 11    Number of Visits 12    Date for PT Re-Evaluation 04/11/21    Authorization Type UHC Medicare    Progress Note Due on Visit 20    PT Start Time 1004    PT Stop Time 1043    PT Time Calculation (min) 39 min    Activity Tolerance Patient limited by fatigue;Patient tolerated treatment well;No increased pain    Behavior During Therapy WFL for tasks assessed/performed             Past Medical History:  Diagnosis Date   Hypertension     Past Surgical History:  Procedure Laterality Date   SKIN BIOPSY     TONSILLECTOMY     TUBAL LIGATION      There were no vitals filed for this visit.   Subjective Assessment - 04/06/21 1005     Subjective Patient states she is feeling better today. She felt fine after last time. Her exercises have been alright.    Currently in Pain? No/denies                               [facility] Adult PT Treatment/Exercise - 04/06/21 0001       Knee/Hip Exercises: Standing   Heel Raises Both;15 reps;2 sets    Hip Flexion Stengthening;Both;3 sets;10 reps;Knee bent    Hip Flexion Limitations 6in toe taps alternating with graded UE assist    Hip Abduction Both;2 sets;10 reps    Abduction Limitations 3#    Forward Step Up Both;2 sets;10 reps;Hand Hold: 2;Step Height: 6"    Other Standing Knee Exercises lateral stepping 5x 10 feet bilateral in parallel bars      Knee/Hip Exercises: Seated   Long Arc Quad Both;10 reps;2 sets    [employer] Weight 3 lbs.    Long [employer] Limitations 5 second holds    Sit to [employer] without UE support;10 reps                      PT Education - 04/06/21 1004     Education Details HEP    Person(s) Educated Patient    Methods Explanation;Demonstration    Comprehension Verbalized understanding;Returned demonstration              PT Short Term Goals - 03/23/21 1018       PT SHORT TERM GOAL #1   Title Patient will be independent with HEP in order to improve functional outcomes.    Time 3    Period Weeks    Status Achieved    Target Date 03/21/21      PT SHORT TERM GOAL #2   Title Patient will report at least 25% improvement in symptoms for improved quality of life.    Baseline 03/23/2021 - Patient reports 100% better as she is able to walk a little without her rolling walker now.    Time 3    Period Weeks    Status Achieved    Target Date 03/21/21  PT Long Term Goals - 04/04/21 1019       PT LONG TERM GOAL #1   Title Patient will report at least 75% improvement in symptoms for improved quality of life.    Baseline 03/23/2021 - Patient reports 100% better as she is able to walk a little without her rolling walker now.    Time 6    Period Weeks    Status Achieved      PT LONG TERM GOAL #2   Title Patient will be able to complete 5x STS in under 14 seconds in order to reduce the risk of falls.    Time 6    Period Weeks    Status On-going      PT LONG TERM GOAL #3   Title Patient will be able to ambulate at least 226 feet in without AD in order to demonstrate improved gait speed for community ambulation.    Baseline 03/23/21 - ambulated 226 feet with quad cane and contact guard assist to supervision with cues for sequencing of steps and equalize step length 11/1 216 feet without AD    Time 6    Period Weeks    Status On-going                   Plan - 04/06/21 1004     Clinical Impression Statement Patient continues to require frequent HHA for balance and strength deficits with exercises. Intermittent cueing needed for posture and mechanics  of exercises. Patient requires intermittent rest breaks for fatigue. Anticipate d/c next session. Patient will continue to benefit from skilled physical therapy in order to improve function and reduce impairment.    Personal Factors and Comorbidities Fitness;Past/Current Experience;Time since onset of injury/illness/exacerbation    Examination-Activity Limitations Locomotion Level;Transfers;Squat;Stairs;Stand;Lift;Hygiene/Grooming    Examination-Participation Restrictions Cleaning;Community Activity;Shop;Volunteer;Pincus Badder [facility]    Stability/Clinical Decision Making Stable/Uncomplicated    Rehab Potential Good    PT Frequency 2x / week    PT Duration 6 weeks    PT Treatment/Interventions ADLs/Self Care Home Management;Aquatic Therapy;Cryotherapy;Electrical Stimulation;Iontophoresis 4mg /ml Dexamethasone;Moist Heat;Traction;Ultrasound;Parrafin;DME Instruction;Gait training;Stair training;Functional mobility training;Therapeutic activities;Therapeutic exercise;Balance training;Neuromuscular re-education;Patient/family education;Manual techniques;Manual lymph drainage;Compression bandaging;Scar mobilization;Passive range of motion;Dry needling;Energy conservation;Splinting;Taping;Spinal Manipulations;Joint Manipulations    PT Next Visit Plan Progress static balance tasks including tandem stance.  Progress functional strengthening and gait training with LRAD.    PT Home Exercise Plan 9/27 march, LAQ, HR/TR, STS; 9/29 - seated hip iso abd/add, issued RTB 10/4 eccentric sit to stands; tandem stance by counter    Consulted and Agree with Plan of Care Patient             Patient will benefit from skilled therapeutic intervention in order to improve the following deficits and impairments:  Abnormal gait, Difficulty walking, Decreased endurance, Decreased activity tolerance, Decreased balance, Impaired flexibility, Improper body mechanics, Increased edema, Decreased strength, Decreased mobility  Visit  Diagnosis: Other abnormalities of gait and mobility  Other symptoms and signs involving the musculoskeletal system  Muscle weakness (generalized)     Problem List Patient Active Problem List   Diagnosis Date Noted   GERD (gastroesophageal reflux disease) 12/14/2019   Essential hypertension 07/15/2015    10:41 AM, 04/06/21 13/03/22 PT, DPT Physical Therapist at [facility]   [facility] [address] [place], [place], [place] Phone: [phone]   Fax:  [phone]  Name: Mariah Ross MRN: [name] Date of Birth: 1945/09/08

## 2021-04-10 ENCOUNTER — Other Ambulatory Visit: Payer: Self-pay

## 2021-04-10 ENCOUNTER — Ambulatory Visit (INDEPENDENT_AMBULATORY_CARE_PROVIDER_SITE_OTHER): Payer: Medicare Other | Admitting: Gastroenterology

## 2021-04-10 ENCOUNTER — Encounter (INDEPENDENT_AMBULATORY_CARE_PROVIDER_SITE_OTHER): Payer: Self-pay | Admitting: Gastroenterology

## 2021-04-10 VITALS — BP 132/84 | HR 89 | Temp 99.9°F | Ht 64.5 in | Wt 161.6 lb

## 2021-04-10 DIAGNOSIS — K219 Gastro-esophageal reflux disease without esophagitis: Secondary | ICD-10-CM

## 2021-04-10 MED ORDER — FAMOTIDINE 20 MG PO TABS: 20.0000 mg | ORAL_TABLET | Freq: Every day | ORAL | 3 refills | Status: DC

## 2021-04-10 NOTE — Progress Notes (Signed)
Referring Provider: Elfredia Nevins, MD Primary Care Physician:  Elfredia Nevins, MD Primary GI Physician: Rehman  Chief Complaint  Patient presents with   Gastroesophageal Reflux    Pt states her pcp put her on omeprazole bid and started having issues with her muscle. She is now on a walker and she started taking just one a day and some better.   HPI:   Mariah Ross is a 75 y.o. female with past medical history of hypertension, GERD.  Patient presenting today for follow up of GERD.  GERD: patient was previously maintained on Pepcid 20mg  daily with good result, however, she reports that in July she presented to PCP with what felt like sinus drainage in her throat, PCP, put her on omeprazole 20mg  BID because they thought phlegm in throat may actually be related to acid reflux. She reports that she started having severe muscle weakness and some peeling of her skin in august, requiring therapy because she had become so weak she could not even do her ADL without assistance. she is now on a walker. She decreased PPI to once daily in September on her own and reports a great improvement in her muscle weakness. She is also on rosuvastatin, she is concerned that the two medications together could have contributed to her severe muscle weakness as well. She denies any acid reflux, heartburn, abdominal pain, early satiety, cough, sore throat, hoarse voice, dysphagia, or odynophagia. She states that Pepcid was previously working very well for her, she questions if she even needs to be on any medication for GERD at all.   She initially she had some weight loss and decrease in her appetite when she started having muscle weakness after starting PPI, however, appetite is improving now that she is doing better and she Is beginning to gain some weight back. She reports having a BM daily.   No red flag symptoms. Patient denies melena, hematochezia, nausea, vomiting, diarrhea, constipation, dysphagia,  odyonophagia, early satiety.  NSAID use:no NSAIDs. Social September wine, no drugs, no tobacco Fam hx: son had colon polyp of unknown type, no CRC  Last Colonoscopy:Dr. October, 2011, normal, internal hemorrhoids, cologuard negative in early 2022.  Last Endoscopy:n/a  Past Medical History:  Diagnosis Date   Hypertension    Past Surgical History:  Procedure Laterality Date   SKIN BIOPSY     TONSILLECTOMY     TUBAL LIGATION      Current Outpatient Medications  Medication Sig Dispense Refill   amLODipine (NORVASC) 2.5 MG tablet Take 2.5 mg by mouth daily. PRN     ibuprofen (ADVIL) 600 MG tablet Take 1 tablet (600 mg total) by mouth every 8 (eight) hours as needed. 30 tablet 0   omeprazole (PRILOSEC) 20 MG capsule Take 20 mg by mouth 2 (two) times daily.     rosuvastatin (CRESTOR) 10 MG tablet Take 10 mg by mouth at bedtime.     No current facility-administered medications for this visit.    Allergies as of 04/10/2021   (No Known Allergies)    Family History  Problem Relation Age of Onset   Hypertension Mother    Cancer Father     Social History   Socioeconomic History   Marital status: Married    Spouse name: Not on file   Number of children: Not on file   Years of education: Not on file   Highest education level: Not on file  Occupational History   Not on file  Tobacco Use  Smoking status: Never   Smokeless tobacco: Never  Vaping Use   Vaping Use: Never used  Substance and Sexual Activity   Alcohol use: Yes    Alcohol/week: 0.0 standard drinks    Comment: socially    Drug use: No   Sexual activity: Not Currently  Other Topics Concern   Not on file  Social History Narrative   Not on file   Social Determinants of Health   Financial Resource Strain: Not on file  Food Insecurity: Not on file  Transportation Needs: Not on file  Physical Activity: Not on file  Stress: Not on file  Social Connections: Not on file   Review of systems General:  negative for malaise, night sweats, fever, chills, +weight loss Neck: Negative for lumps, goiter, pain and significant neck swelling Resp: Negative for cough, wheezing, dyspnea at rest CV: Negative for chest pain, leg swelling, palpitations, orthopnea GI: denies melena, hematochezia, nausea, vomiting, diarrhea, constipation, dysphagia, odyonophagia, early satiety or unintentional weight loss.  MSK: Negative for joint pain or swelling, back pain, +muscle weakness Derm: Negative for itching or rash Psych: Denies depression, anxiety, memory loss, confusion. No homicidal or suicidal ideation.  Heme: Negative for prolonged bleeding, bruising easily, and swollen nodes. Endocrine: Negative for cold or heat intolerance, polyuria, polydipsia and goiter. Neuro: negative for tremor, gait imbalance, syncope and seizures. The remainder of the review of systems is noncontributory.  Physical Exam: There were no vitals taken for this visit. General:   Alert and oriented. No distress noted. Pleasant and cooperative.  Head:  Normocephalic and atraumatic. Eyes:  Conjuctiva clear without scleral icterus. Mouth:  Oral mucosa pink and moist. Good dentition. No lesions. Heart: Normal rate and rhythm, s1 and s2 heart sounds present.  Lungs: Clear lung sounds in all lobes. Respirations equal and unlabored. Abdomen:  +BS, soft, non-tender and non-distended. No rebound or guarding. No HSM or masses noted. Derm: No palmar erythema or jaundice Msk:  Symmetrical without gross deformities. Normal posture. Presence of walker for ambulation Extremities:  Without edema. Neurologic:  Alert and  oriented x4 Psych:  Alert and cooperative. Normal mood and affect.  Invalid input(s): 6 MONTHS   ASSESSMENT: Mariah Ross is a 75 y.o. female presenting today for follow up of GERD.  She is currently on omeprazole 20mg  daily, after being started on twice daily dosing in July by PCP who suspected acid reflux given her c/o  phlegm in her throat, previously well maintained on pepcid 20mg  daily. She had significant muscle weakness after starting omeprazole in July, she decreased dose to once daily in September and started physical therapy 6 weeks ago with significant improvement in her symptoms. She is now able to do ADL tasks without assistance, though she is still using a walker to help with ambulation. I suspect her symptoms could be related in part to both statin and omeprazole together, however it is possible she had an adverse reaction to the omeprazole given her symptoms, I advised her to stop PPI at this time. I am sending Rx for famotidine 20mg  daily for her to have, however, we discussed in depth that if she is not experiencing reflux symptoms (heart burn dry cough, sore throat, acid regurgitation, hoarse voice, thickness in throat), she can hold off on restarting the famotidine, if she starts to notice symptoms, she can take famotidine as needed or go back to once daily. She should also discuss her statin with PCP given her significant recent muscle issues. She has no red  flag symptoms at this time. She will give me a call if she develops any new GI symptoms.    PLAN:  Stop omeprazole 2. Restart famotidine 20mg , can take daily or PRN if reflux symptoms are well controlled 3.  Talk with PCP about statin 4. Patient will call me with any new GI symptoms   Follow Up: 1 year  Khyra Viscuso L. , MSN, APRN, AGNP-C Adult-Gerontology Nurse Practitioner [facility]

## 2021-04-10 NOTE — Patient Instructions (Signed)
Stop the omeprazole.  I have sent famotidine 20mg  to your pharmacy, you can see how you do off of this medication, if you are not noticing heart burn, acid reflux, cough, sore throat or thickness in your throat, you can take this medication as needed.  Please talk with your doctor about your statin and your muscle issues as these could be related.  It was a pleasure caring for you today!  Follow up 1 year, call me if you need me sooner

## 2021-04-11 ENCOUNTER — Ambulatory Visit (HOSPITAL_COMMUNITY): Payer: Medicare Other | Admitting: Physical Therapy

## 2021-04-11 ENCOUNTER — Encounter (HOSPITAL_COMMUNITY): Payer: Self-pay | Admitting: Physical Therapy

## 2021-04-11 DIAGNOSIS — R2689 Other abnormalities of gait and mobility: Secondary | ICD-10-CM

## 2021-04-11 DIAGNOSIS — M6281 Muscle weakness (generalized): Secondary | ICD-10-CM | POA: Diagnosis not present

## 2021-04-11 DIAGNOSIS — R29898 Other symptoms and signs involving the musculoskeletal system: Secondary | ICD-10-CM

## 2021-04-11 NOTE — Patient Instructions (Signed)
Access Code: [email or web address] URL: [email or web address] Date: 04/11/2021 Prepared by: Greig Castilla Rajanae Mantia  Exercises Standing Hip Abduction with Counter Support - 1 x daily - 7 x weekly - 3 sets - 15 reps Standing Marching - 1 x daily - 7 x weekly - 3 sets - 10 reps

## 2021-04-11 NOTE — Therapy (Signed)
[place], [place], [address] Phone: [phone]   Fax:  [phone]  Physical Therapy Treatment/Discharge Summary  Patient Details  Name: Mariah Ross MRN: [id number] Date of Birth: 05/04/1946 Referring Provider (PT): Sanjuana Kava MD   Encounter Date: 04/11/2021  PHYSICAL THERAPY DISCHARGE SUMMARY  Visits from Start of Care: 12  Current functional level related to goals / functional outcomes: See below   Remaining deficits: See below   Education / Equipment: See below   Patient agrees to discharge. Patient goals were met. Patient is being discharged due to being pleased with the current functional level.    PT End of Session - 04/11/21 1000     Visit Number 12    Number of Visits 12    Date for PT Re-Evaluation 04/11/21    Authorization Type UHC Medicare    Progress Note Due on Visit 20    PT Start Time 1000    PT Stop Time 1040    PT Time Calculation (min) 40 min    Activity Tolerance Patient tolerated treatment well    Behavior During Therapy WFL for tasks assessed/performed             Past Medical History:  Diagnosis Date   Chronic GERD    Hypertension     Past Surgical History:  Procedure Laterality Date   SKIN BIOPSY     TONSILLECTOMY     TUBAL LIGATION      There were no vitals filed for this visit.   Subjective Assessment - 04/11/21 1001     Subjective She has been doing HEP. Patient states 100% improvement but she has to get more energy in her immune system.    Currently in Pain? No/denies                [facility] PT Assessment - 04/11/21 0001       Assessment   Medical Diagnosis Bilateral foot and ankle pain; weakness    Referring Provider (PT) Sanjuana Kava MD    Onset Date/Surgical Date 01/26/21    Prior Therapy none      Precautions   Precautions Fall      Restrictions   Weight Bearing Restrictions No      Balance Screen   Has the patient fallen  in the past 6 months No    Has the patient had a decrease in activity level because of a fear of falling?  No    Is the patient reluctant to leave their home because of a fear of falling?  No      Prior Function   Level of Independence Independent    Vocation Retired      Charity fundraiser Status Within Functional Limits for tasks assessed      Observation/Other Assessments   Observations ambulates with RW    Focus on Therapeutic Outcomes (FOTO)  n/a - gait and balance      Transfers   Five time sit to stand comments  24.37 without UE use      Ambulation/Gait   Ambulation/Gait Yes    Ambulation/Gait Assistance 5: Supervision;4: Min guard    Ambulation Distance (Feet) 250 Feet    Assistive device None    Gait Pattern Trunk flexed    Ambulation Surface Level;Indoor    Gait Comments 2MWT; slow, flexed pattern gait  [facility] Adult PT Treatment/Exercise - 04/11/21 0001       Knee/Hip Exercises: Standing   Heel Raises Both;15 reps;2 sets    Knee Flexion Both;2 sets;10 reps    Hip Flexion Stengthening;Both;3 sets;Knee bent;15 reps    Hip Flexion Limitations 6in toe taps alternating with graded UE assist    Hip Abduction Both;2 sets;15 reps    Abduction Limitations 3#    Forward Step Up Both;2 sets;10 reps;Hand Hold: 2;Step Height: 6"      Knee/Hip Exercises: Seated   Long Arc Quad Both;10 reps;2 sets    Long Arc Quad Weight 3 lbs.    Long [employer] Limitations 5 second holds    Sit to [employer] without UE support;10 reps                     PT Education - 04/11/21 1000     Education Details HEP    Person(s) Educated Patient    Methods Explanation;Demonstration    Comprehension Verbalized understanding;Returned demonstration              PT Short Term Goals - 03/23/21 1018       PT SHORT TERM GOAL #1   Title Patient will be independent with HEP in order to improve functional outcomes.    Time 3     Period Weeks    Status Achieved    Target Date 03/21/21      PT SHORT TERM GOAL #2   Title Patient will report at least 25% improvement in symptoms for improved quality of life.    Baseline 03/23/2021 - Patient reports 100% better as she is able to walk a little without her rolling walker now.    Time 3    Period Weeks    Status Achieved    Target Date 03/21/21               PT Long Term Goals - 04/11/21 1011       PT LONG TERM GOAL #1   Title Patient will report at least 75% improvement in symptoms for improved quality of life.    Baseline 03/23/2021 - Patient reports 100% better as she is able to walk a little without her rolling walker now.    Time 6    Period Weeks    Status Achieved      PT LONG TERM GOAL #2   Title Patient will be able to complete 5x STS in under 14 seconds in order to reduce the risk of falls.    Time 6    Period Weeks    Status Not Met      PT LONG TERM GOAL #3   Title Patient will be able to ambulate at least 226 feet in 2MWT without AD in order to demonstrate improved gait speed for community ambulation.    Baseline 03/23/21 - ambulated 226 feet with quad cane and contact guard assist to supervision with cues for sequencing of steps and equalize step length 11/1 216 feet without AD 11/8 250 feet    Time 6    Period Weeks    Status Achieved                   Plan - 04/11/21 1000     Clinical Impression Statement Patient has met 2/2 short term goals and 2/3 long term goals with ability to complete HEP and improvement in symptoms, strength, gait, and functional mobility. Remaining goal not met due to  continued deficits in functional strength although patient able to complete STS without UE support. Reviewed HEP with patient and completed previous exercises. Intermittent cueing for posture and mechanics provided. Patient educated on returning to physical therapy if needed. Patient discharged from physical therapy at this time.    Personal  Factors and Comorbidities Fitness;Past/Current Experience;Time since onset of injury/illness/exacerbation    Examination-Activity Limitations Locomotion Level;Transfers;Squat;Stairs;Stand;Lift;Hygiene/Grooming    Examination-Participation Restrictions Cleaning;Community Activity;Shop;Volunteer;Dorita Sciara    Stability/Clinical Decision Making Stable/Uncomplicated    Rehab Potential Good    PT Frequency --    PT Duration --    PT Treatment/Interventions ADLs/Self Care Home Management;Aquatic Therapy;Cryotherapy;Electrical Stimulation;Iontophoresis 6m/ml Dexamethasone;Moist Heat;Traction;Ultrasound;Parrafin;DME Instruction;Gait training;Stair training;Functional mobility training;Therapeutic activities;Therapeutic exercise;Balance training;Neuromuscular re-education;Patient/family education;Manual techniques;Manual lymph drainage;Compression bandaging;Scar mobilization;Passive range of motion;Dry needling;Energy conservation;Splinting;Taping;Spinal Manipulations;Joint Manipulations    PT Next Visit Plan n/a    PT Home Exercise Plan 9/27 march, LAQ, HR/TR, STS; 9/29 - seated hip iso abd/add, issued RTB 10/4 eccentric sit to stands; tandem stance by counter 11/8 hip abd, marching    Consulted and Agree with Plan of Care Patient             Patient will benefit from skilled therapeutic intervention in order to improve the following deficits and impairments:  Abnormal gait, Difficulty walking, Decreased endurance, Decreased activity tolerance, Decreased balance, Impaired flexibility, Improper body mechanics, Increased edema, Decreased strength, Decreased mobility  Visit Diagnosis: Other abnormalities of gait and mobility  Other symptoms and signs involving the musculoskeletal system  Muscle weakness (generalized)     Problem List Patient Active Problem List   Diagnosis Date Noted   Gastroesophageal reflux disease 12/14/2019   Essential hypertension 07/15/2015   10:42 AM,  04/11/21 AMearl LatinPT, DPT [place] [address]Phone: [phone]  Fax:  [phone] Name: Mariah BRETTMRN: [id number] of Birth: [birth year]

## 2021-04-13 ENCOUNTER — Other Ambulatory Visit: Payer: Self-pay

## 2021-04-13 ENCOUNTER — Ambulatory Visit: Payer: Medicare Other | Admitting: Orthopaedic Surgery

## 2021-04-13 ENCOUNTER — Encounter (HOSPITAL_COMMUNITY): Payer: Medicare Other | Admitting: Physical Therapy

## 2021-04-13 ENCOUNTER — Encounter: Payer: Self-pay | Admitting: Orthopaedic Surgery

## 2021-04-13 VITALS — BP 134/85 | HR 86 | Ht 64.0 in | Wt 164.1 lb

## 2021-04-13 DIAGNOSIS — R531 Weakness: Secondary | ICD-10-CM

## 2021-04-13 NOTE — Progress Notes (Signed)
I am doing much better.  She has been to PT for generalized weakness and failure to move about.  She has been helped greatly and she is using her walker well now.  She has less pain and less pain of multiple joints. She is motivated to continue to improve.  She had been depressed she would never get about before.  I have read the PT reports.  She is discharged from PT now.  She is doing the exercises at home and continues to become stronger.  She is pleased at her progress.  Muscle tone and strength is good today.  She uses the walker but she is not leaning into it as before. She has no neuro defect.  Encounter Diagnosis  Name Primary?   Generalized weakness Yes   Continue exercises at home.  Return in one month.  Call if any problem.  Precautions discussed.  Electronically Signed Darreld Mclean, MD 11/10/20229:12 AM

## 2021-04-17 ENCOUNTER — Other Ambulatory Visit (INDEPENDENT_AMBULATORY_CARE_PROVIDER_SITE_OTHER): Payer: Self-pay

## 2021-04-17 MED ORDER — FAMOTIDINE 20 MG PO TABS: 20.0000 mg | ORAL_TABLET | Freq: Every day | ORAL | 3 refills | Status: DC

## 2021-04-19 ENCOUNTER — Other Ambulatory Visit (INDEPENDENT_AMBULATORY_CARE_PROVIDER_SITE_OTHER): Payer: Self-pay

## 2021-04-19 ENCOUNTER — Other Ambulatory Visit (INDEPENDENT_AMBULATORY_CARE_PROVIDER_SITE_OTHER): Payer: Self-pay | Admitting: *Deleted

## 2021-04-19 MED ORDER — FAMOTIDINE 20 MG PO TABS: 20.0000 mg | ORAL_TABLET | Freq: Every day | ORAL | 3 refills | Status: DC

## 2021-05-11 ENCOUNTER — Other Ambulatory Visit: Payer: Self-pay

## 2021-05-11 ENCOUNTER — Encounter: Payer: Self-pay | Admitting: Orthopaedic Surgery

## 2021-05-11 ENCOUNTER — Ambulatory Visit: Payer: Medicare Other | Admitting: Orthopaedic Surgery

## 2021-05-11 VITALS — BP 125/82 | HR 91 | Ht 64.5 in | Wt 159.0 lb

## 2021-05-11 DIAGNOSIS — R531 Weakness: Secondary | ICD-10-CM | POA: Diagnosis not present

## 2021-05-11 NOTE — Progress Notes (Signed)
I am dong better.  She is able to get up out of chair by herself today and use the walker.  She can move around fairly well.  She is doing the exercises at home. She gets tired very easily and we need to build up the endurance but her strength is better.  I have discussed with her and her husband need to increase endurance and continue the exercises she is doing.  She is doing quite well and has come a long way.  She is pleased.  Encounter Diagnosis  Name Primary?   Generalized weakness Yes   Continue exercises.  Return in six weeks.  Try to get out of house some weather permitting.  Call if any problem.  Precautions discussed.  Electronically Signed Darreld Mclean, MD 12/8/20229:15 AM

## 2021-06-22 ENCOUNTER — Encounter: Payer: Self-pay | Admitting: Orthopaedic Surgery

## 2021-06-22 ENCOUNTER — Other Ambulatory Visit: Payer: Self-pay

## 2021-06-22 ENCOUNTER — Ambulatory Visit: Payer: Medicare Other | Admitting: Orthopaedic Surgery

## 2021-06-22 VITALS — BP 145/92 | HR 84 | Ht 64.5 in | Wt 160.0 lb

## 2021-06-22 DIAGNOSIS — G8929 Other chronic pain: Secondary | ICD-10-CM

## 2021-06-22 DIAGNOSIS — M25561 Pain in right knee: Secondary | ICD-10-CM | POA: Diagnosis not present

## 2021-06-22 DIAGNOSIS — R531 Weakness: Secondary | ICD-10-CM | POA: Diagnosis not present

## 2021-06-22 NOTE — Progress Notes (Signed)
I feel much better.  She has gotten better and is building up her endurance now.  Her strength has returned.  She has much better days now.  She has some right knee swelling, popping but no giving way.  Right knee has effusion, crepitus, medial joint line pain, stable, ROM 0 to 105, limp right, NV intact.  Encounter Diagnoses  Name Primary?   Generalized weakness Yes   Chronic pain of right knee    Continue exercises.  Aspercreme, Biofreeze or Voltaren gel to knee prn.  Return in two months.  Call if any problem.  Precautions discussed.  Electronically Signed Darreld Mclean, MD 1/19/20239:27 AM   She is celebrating her 58th wedding anniversary today.

## 2021-06-27 ENCOUNTER — Other Ambulatory Visit (HOSPITAL_COMMUNITY): Payer: Self-pay | Admitting: Internal Medicine

## 2021-06-27 DIAGNOSIS — Z1231 Encounter for screening mammogram for malignant neoplasm of breast: Secondary | ICD-10-CM

## 2021-07-03 ENCOUNTER — Other Ambulatory Visit: Payer: Self-pay

## 2021-07-03 ENCOUNTER — Ambulatory Visit (HOSPITAL_COMMUNITY)
Admission: RE | Admit: 2021-07-03 | Discharge: 2021-07-03 | Disposition: A | Discharge status: Home / Self Care | Payer: Medicare Other | Source: Ambulatory Visit | Attending: Internal Medicine | Admitting: Internal Medicine

## 2021-07-03 DIAGNOSIS — Z1231 Encounter for screening mammogram for malignant neoplasm of breast: Secondary | ICD-10-CM | POA: Diagnosis not present

## 2021-08-22 ENCOUNTER — Other Ambulatory Visit: Payer: Self-pay

## 2021-08-22 ENCOUNTER — Encounter: Payer: Self-pay | Admitting: Orthopaedic Surgery

## 2021-08-22 ENCOUNTER — Ambulatory Visit: Payer: Medicare Other | Admitting: Orthopaedic Surgery

## 2021-08-22 VITALS — BP 136/83 | HR 75 | Ht 64.0 in | Wt 159.0 lb

## 2021-08-22 DIAGNOSIS — M25561 Pain in right knee: Secondary | ICD-10-CM | POA: Diagnosis not present

## 2021-08-22 DIAGNOSIS — G8929 Other chronic pain: Secondary | ICD-10-CM

## 2021-08-22 NOTE — Progress Notes (Signed)
My knee hurts. ? ?She has chronic pain of the right knee.  She uses a cane.  She has had generalized weakness in the past but that has resolved.  She is back to "Normal" with that. ? ?She cannot fully extend the right knee.  She needs a cane to walk.  I have told her that she is a candidate for total knee but she wants to hold off on that. ? ?I have recommended Aleve one after breakfast daily.   ? ?Right knee has effusion, ROM 5 to 95 with medial pain, positive medial McMurray, crepitus, NV intact.  No distal edema. ? ?Encounter Diagnosis  ?Name Primary?  ? Chronic pain of right knee Yes  ? ?Take the Aleve daily after breakfast. ? ?Return in six weeks. ? ?Call if any problem. ? ?Precautions discussed. ? ?Electronically Signed ?Darreld Mclean, MD ?3/21/20239:34 AM ? ?

## 2021-08-30 ENCOUNTER — Telehealth: Payer: Self-pay | Admitting: Orthopaedic Surgery

## 2021-08-30 NOTE — Telephone Encounter (Signed)
Called patient to relay form for handicapped placard has been completed by Dr Luna Glasgow., and ready for pick up ?

## 2021-10-05 ENCOUNTER — Ambulatory Visit: Payer: Medicare Other | Admitting: Orthopaedic Surgery

## 2021-10-05 ENCOUNTER — Encounter: Payer: Self-pay | Admitting: Orthopaedic Surgery

## 2021-10-05 VITALS — BP 133/91 | HR 82

## 2021-10-05 DIAGNOSIS — G8929 Other chronic pain: Secondary | ICD-10-CM

## 2021-10-05 DIAGNOSIS — M25561 Pain in right knee: Secondary | ICD-10-CM

## 2021-10-05 NOTE — Progress Notes (Signed)
My knee is better. ? ?She says her right knee got better on its own.  She did not take the Aleve.  She uses a cane and has some pain of the right knee at times but overall she is doing well.  She has no new trauma. ? ?She has made significant progress over the last year. She had marked generalized weakness when first seen. She has improved where she can get around and do what she would like to do now without pain. ? ?Right knee has slight effusion and crepitus but ROM is 0 to 110 without pain.  Gait is good.  She uses the cane for safety reasons she says.  NV intact. ? ?Encounter Diagnosis  ?Name Primary?  ? Chronic pain of right knee Yes  ? ?I will see her as needed. ? ?Call if any problem. ? ?Precautions discussed. ? ?Electronically Signed ?Sanjuana Kava, MD ?5/4/20239:29 AM ? ?

## 2021-10-23 DIAGNOSIS — H25813 Combined forms of age-related cataract, bilateral: Secondary | ICD-10-CM | POA: Diagnosis not present

## 2021-11-17 ENCOUNTER — Ambulatory Visit (INDEPENDENT_AMBULATORY_CARE_PROVIDER_SITE_OTHER): Payer: Medicare Other | Admitting: Cardiology

## 2021-11-17 ENCOUNTER — Encounter: Payer: Self-pay | Admitting: Cardiology

## 2021-11-17 VITALS — BP 124/82 | HR 87 | Ht 64.0 in | Wt 168.8 lb

## 2021-11-17 DIAGNOSIS — E782 Mixed hyperlipidemia: Secondary | ICD-10-CM

## 2021-11-17 DIAGNOSIS — I10 Essential (primary) hypertension: Secondary | ICD-10-CM | POA: Diagnosis not present

## 2021-11-17 DIAGNOSIS — R002 Palpitations: Secondary | ICD-10-CM

## 2021-11-17 NOTE — Patient Instructions (Signed)

## 2021-11-17 NOTE — Progress Notes (Signed)
Clinical Summary Mariah Ross is a 76 y.o.female seen today for follow up of the following medical problems.      1. Palpitations 10/2018 monitor: No significant arrhythmias - no recent palpitations. Symptoms with weaning caffeine.      2. GERD - followed by pcp     3. HTN - compliant with meds - takes norvasc just prn, has not needed - home bp 130s/80s   4. Hyperlipidemia - compliant with statin - labs followed by pcp    -stopped crestor 4-5 months ago on her own due to weakness.   -reports upcoming labs next month    Past Medical History:  Diagnosis Date   Chronic GERD    Hypertension      No Known Allergies   Current Outpatient Medications  Medication Sig Dispense Refill   amLODipine (NORVASC) 2.5 MG tablet Take 2.5 mg by mouth daily. PRN     famotidine (PEPCID) 20 MG tablet Take 1 tablet (20 mg total) by mouth daily. 90 tablet 3   rosuvastatin (CRESTOR) 10 MG tablet Take 10 mg by mouth at bedtime.     No current facility-administered medications for this visit.     Past Surgical History:  Procedure Laterality Date   SKIN BIOPSY     TONSILLECTOMY     TUBAL LIGATION       No Known Allergies    Family History  Problem Relation Age of Onset   Hypertension Mother    Cancer Father      Social History [name] reports that she has never smoked. She has never used smokeless tobacco. [name] reports current alcohol use.   Review of Systems CONSTITUTIONAL: No weight loss, fever, chills, weakness or fatigue.  HEENT: Eyes: No visual loss, blurred vision, double vision or yellow sclerae.No hearing loss, sneezing, congestion, runny nose or sore throat.  SKIN: No rash or itching.  CARDIOVASCULAR: per hpi RESPIRATORY: No shortness of breath, cough or sputum.  GASTROINTESTINAL: No anorexia, nausea, vomiting or diarrhea. No abdominal pain or blood.  GENITOURINARY: No burning on urination, no polyuria NEUROLOGICAL: No headache, dizziness,  syncope, paralysis, ataxia, numbness or tingling in the extremities. No change in bowel or bladder control.  MUSCULOSKELETAL: No muscle, back pain, joint pain or stiffness.  LYMPHATICS: No enlarged nodes. No history of splenectomy.  PSYCHIATRIC: No history of depression or anxiety.  ENDOCRINOLOGIC: No reports of sweating, cold or heat intolerance. No polyuria or polydipsia.  [name]   Physical Examination Today's Vitals   11/17/21 1125  BP: 124/82  Pulse: 87  SpO2: 97%  Weight: 168 lb 12.8 oz (76.6 kg)  Height: 5\' 4"  (1.626 m)   Body mass index is 28.97 kg/m.  Gen: resting comfortably, no acute distress HEENT: no scleral icterus, pupils equal round and reactive, no palptable cervical adenopathy,  CV: RRR, no m/r/g no jvd Resp: Clear to auscultation bilaterally GI: abdomen is soft, non-tender, non-distended, normal bowel sounds, no hepatosplenomegaly MSK: extremities are warm, no edema.  Skin: warm, no rash Neuro:  no focal deficits Psych: appropriate affect   Diagnostic Studies     Assessment and Plan  1. Palpitations - sypmtoms have improved with weaning of caffeine - no recent issues, continue to monitor at this time - EKG today shows NSR   2. HTN - at goal, actually just using norvasc prn and has not had to use - continue to monitor   3. Hyperlipidemia -she stopped crestor on her own due to muscle aches/weakness. -  upcoming labs with pcp, could consider low dose pravastatin pending results.   F/u 1 year    Antoine Poche, M.D.

## 2021-11-19 ENCOUNTER — Other Ambulatory Visit (INDEPENDENT_AMBULATORY_CARE_PROVIDER_SITE_OTHER): Payer: Self-pay | Admitting: Gastroenterology

## 2021-12-29 ENCOUNTER — Ambulatory Visit (HOSPITAL_COMMUNITY)
Admission: RE | Admit: 2021-12-29 | Discharge: 2021-12-29 | Disposition: A | Discharge status: Home / Self Care | Payer: Medicare Other | Source: Ambulatory Visit | Attending: Internal Medicine | Admitting: Internal Medicine

## 2021-12-29 ENCOUNTER — Other Ambulatory Visit (HOSPITAL_COMMUNITY): Payer: Self-pay | Admitting: Internal Medicine

## 2021-12-29 DIAGNOSIS — B354 Tinea corporis: Secondary | ICD-10-CM | POA: Diagnosis not present

## 2021-12-29 DIAGNOSIS — Z0001 Encounter for general adult medical examination with abnormal findings: Secondary | ICD-10-CM | POA: Diagnosis not present

## 2021-12-29 DIAGNOSIS — E559 Vitamin D deficiency, unspecified: Secondary | ICD-10-CM | POA: Diagnosis not present

## 2021-12-29 DIAGNOSIS — K219 Gastro-esophageal reflux disease without esophagitis: Secondary | ICD-10-CM | POA: Diagnosis not present

## 2021-12-29 DIAGNOSIS — R011 Cardiac murmur, unspecified: Secondary | ICD-10-CM

## 2021-12-29 DIAGNOSIS — I10 Essential (primary) hypertension: Secondary | ICD-10-CM | POA: Diagnosis not present

## 2021-12-29 DIAGNOSIS — E039 Hypothyroidism, unspecified: Secondary | ICD-10-CM | POA: Diagnosis not present

## 2021-12-29 DIAGNOSIS — D518 Other vitamin B12 deficiency anemias: Secondary | ICD-10-CM | POA: Diagnosis not present

## 2022-01-25 ENCOUNTER — Telehealth: Payer: Self-pay | Admitting: Internal Medicine

## 2022-01-25 ENCOUNTER — Encounter: Payer: Self-pay | Admitting: *Deleted

## 2022-01-25 DIAGNOSIS — R002 Palpitations: Secondary | ICD-10-CM

## 2022-01-25 DIAGNOSIS — R011 Cardiac murmur, unspecified: Secondary | ICD-10-CM

## 2022-01-25 NOTE — Telephone Encounter (Signed)
Patient c/o Palpitations:  High priority if patient c/o lightheadedness, shortness of breath, or chest pain  How long have you had palpitations/irregular HR/ Afib? Are you having the symptoms now?  No  Are you currently experiencing lightheadedness, SOB or CP?   No  Do you have a history of afib (atrial fibrillation) or irregular heart rhythm? No  Have you checked your BP or HR? (document readings if available):  Not available  Are you experiencing any other symptoms? No  Patient stated that at her annual wellness visit her PCP told her he detected a heart murmur.  Patient stated her heart rate has been different.  Patient would like a soonest appointment with Dr. Wyline Mood.

## 2022-01-25 NOTE — Telephone Encounter (Signed)
Per patient, she saw Dr. Sherwood Gambler 2-3 weeks ago and she was told that he detected a heart murmur via ascultation.  Denies dizziness, chest pain or sob.  Patient is requesting a sooner appointment to evaluate murmur Gave first available with Lenze on 02/26/2022 11 am [place] office Awaiting requested records from PCP Patient informed and verbalized understanding of plan.

## 2022-01-25 NOTE — Telephone Encounter (Signed)
PCP office note requested First available appointment given to see Mariah Ross on 02/26/2022 @11  am [place] office

## 2022-01-26 NOTE — Telephone Encounter (Signed)
Please obtain echo for heart murmur prior to her appointment  JBranch MD

## 2022-01-29 ENCOUNTER — Encounter: Payer: Self-pay | Admitting: *Deleted

## 2022-01-29 NOTE — Telephone Encounter (Signed)
Echocardiogram requested from pcp

## 2022-01-29 NOTE — Telephone Encounter (Signed)
Misread below message and request echo from PCP-no echo available at pcp office  Patient informed that echo will be scheduled at [facility] office Verbalized understanding of plan.

## 2022-01-29 NOTE — Addendum Note (Signed)
Addended by: Eustace Moore on: 01/29/2022 02:55 PM   Modules accepted: Orders

## 2022-02-06 ENCOUNTER — Other Ambulatory Visit: Payer: Self-pay | Admitting: Cardiology

## 2022-02-06 DIAGNOSIS — R011 Cardiac murmur, unspecified: Secondary | ICD-10-CM

## 2022-02-06 DIAGNOSIS — I499 Cardiac arrhythmia, unspecified: Secondary | ICD-10-CM

## 2022-02-08 ENCOUNTER — Ambulatory Visit: Payer: Medicare Other | Attending: Cardiology

## 2022-02-08 DIAGNOSIS — I499 Cardiac arrhythmia, unspecified: Secondary | ICD-10-CM

## 2022-02-08 DIAGNOSIS — R011 Cardiac murmur, unspecified: Secondary | ICD-10-CM

## 2022-02-08 LAB — ECHOCARDIOGRAM COMPLETE
AR max vel: 1.64 cm2
AV Peak grad: 10.2 mmHg
Ao pk vel: 1.6 m/s
Area-P 1/2: 3.46 cm2
Calc EF: 58.7 %
S' Lateral: 2.87 cm
Single Plane A2C EF: 61.9 %
Single Plane A4C EF: 57.7 %

## 2022-02-19 NOTE — Progress Notes (Signed)
Cardiology Office Note:    Date:  02/26/2022   ID:  Mariah Ross, DOB 06/21/1945, MRN [id number]  PCP:  Elfredia Nevins, MD  [facility] Providers Cardiologist:  Dina Rich, MD     Referring MD: Elfredia Nevins, MD   Chief Complaint:  Follow-up     History of Present Illness:   Mariah Ross is a 76 y.o. female with history of palpitations-no arrhythmias on monitor 2020, resolved with weaning caffeine, HTN, HLD-stopped crestor due to weakness.     Patient placed on my schedule b/c of heart murmur. Echo 02/08/22 normal LVEF 55-60%, G1DD, trivial MR. Last saw Dr. Wyline Mood 11/2021 and doing well.  Patient comes in to review echo. She is happy to hear echo was good. Some ankle edema. Only takes amlodipine if BP gets high per PCP.    Past Medical History:  Diagnosis Date   Chronic GERD    Hypertension    Current Medications: Current Meds  Medication Sig   amLODipine (NORVASC) 2.5 MG tablet Take 2.5 mg by mouth daily. PRN   famotidine (PEPCID) 20 MG tablet TAKE 1 TABLET BY MOUTH DAILY   rosuvastatin (CRESTOR) 10 MG tablet Take 10 mg by mouth at bedtime.    Allergies:   Patient has no known allergies.   Social History   Tobacco Use   Smoking status: Never   Smokeless tobacco: Never  Vaping Use   Vaping Use: Never used  Substance Use Topics   Alcohol use: Yes    Comment: socially - wine   Drug use: No    Family Hx: The patient's family history includes Cancer in her father; Hypertension in her mother.  ROS   EKGs/Labs/Other Test Reviewed:    EKG:  EKG is  not ordered today.    Recent Labs: No results found for requested labs within last 365 days.   Recent Lipid Panel No results for input(s): "CHOL", "TRIG", "HDL", "VLDL", "LDLCALC", "LDLDIRECT" in the last 8760 hours.   Prior CV Studies:   Echo 02/08/22 IMPRESSIONS     1. Left ventricular ejection fraction, by estimation, is 55 to 60%. The  left ventricle has normal function. The left  ventricle has no regional  wall motion abnormalities. Left ventricular diastolic parameters are  consistent with Grade I diastolic  dysfunction (impaired relaxation). The average left ventricular global  longitudinal strain is 23.4 %. The global longitudinal strain is normal.   2. Right ventricular systolic function is normal. The right ventricular  size is normal. There is normal pulmonary artery systolic pressure. The  estimated right ventricular systolic pressure is 23.2 mmHg.   3. Left atrial size was upper normal.   4. The mitral valve is grossly normal. Trivial mitral valve  regurgitation.   5. The aortic valve is tricuspid. Aortic valve regurgitation is not  visualized.   6. The inferior vena cava is normal in size with greater than 50%  respiratory variability, suggesting right atrial pressure of 3 mmHg.   Comparison(s): Prior images unable to be directly viewed.     Risk Assessment/Calculations/Metrics:              Physical Exam:    VS:  BP 132/84   Pulse 82   Ht 5\' 4"  (1.626 m)   Wt 170 lb (77.1 kg)   SpO2 98%   BMI 29.18 kg/m     Wt Readings from Last 3 Encounters:  02/26/22 170 lb (77.1 kg)  11/17/21 168 lb 12.8 oz (76.6  kg)  08/22/21 159 lb (72.1 kg)    Physical Exam  GEN: Well nourished, well developed, in no acute distress  eck: no JVD, carotid bruits, or masses Cardiac:RRR; no murmurs, rubs, or gallops  Respiratory:  clear to auscultation bilaterally, normal work of breathing GI: soft, nontender, nondistended, + BS Ext: trace ankle edema, otherwise without cyanosis, clubbing,  Good distal pulses bilaterally Neuro:  Alert and Oriented x 3, Strength and sensation are intact Psych: euthymic mood, full affect       ASSESSMENT & PLAN:   No problem-specific Assessment & Plan notes found for this encounter.   Heart murmur-trivial MR and aortic calcification on echo 02/08/22 normal LVEF. F/u in a year  Palpitations better off caffeine  HTN-2 gm sodium  diet on amlodipine prn  HLD on crestor          Dispo:  No follow-ups on file.   Medication Adjustments/Labs and Tests Ordered: Current medicines are reviewed at length with the patient today.  Concerns regarding medicines are outlined above.  Tests Ordered: No orders of the defined types were placed in this encounter.  Medication Changes: No orders of the defined types were placed in this encounter.  Signed, Ermalinda Barrios, PA-C  02/26/2022 11:22 AM    [address], [place], [place]  [address] Phone: [phone]; Fax: [phone]

## 2022-02-26 ENCOUNTER — Ambulatory Visit: Payer: Medicare Other | Attending: Physician Assistant | Admitting: Physician Assistant

## 2022-02-26 ENCOUNTER — Encounter: Payer: Self-pay | Admitting: Physician Assistant

## 2022-02-26 VITALS — BP 132/84 | HR 82 | Ht 64.0 in | Wt 170.0 lb

## 2022-02-26 DIAGNOSIS — E782 Mixed hyperlipidemia: Secondary | ICD-10-CM

## 2022-02-26 DIAGNOSIS — R011 Cardiac murmur, unspecified: Secondary | ICD-10-CM

## 2022-02-26 DIAGNOSIS — R002 Palpitations: Secondary | ICD-10-CM | POA: Diagnosis not present

## 2022-02-26 DIAGNOSIS — I10 Essential (primary) hypertension: Secondary | ICD-10-CM | POA: Diagnosis not present

## 2022-02-26 NOTE — Patient Instructions (Signed)
Medication Instructions:  Your physician recommends that you continue on your current medications as directed. Please refer to the Current Medication list given to you today.  - TAKE BP MEDICATION IF YOUR BP IS 135/85  Labwork: None  Testing/Procedures: None  Follow-Up: Follow up with Dr. Harl Bowie in 1 year.   Any Other Special Instructions Will Be Listed Below (If Applicable).     If you need a refill on your cardiac medications before your next appointment, please call your pharmacy.  Two Gram Sodium Diet 2000 mg  What is Sodium? Sodium is a mineral found naturally in many foods. The most significant source of sodium in the diet is table salt, which is about 40% sodium.  Processed, convenience, and preserved foods also contain a large amount of sodium.  The body needs only 500 mg of sodium daily to function,  A normal diet provides more than enough sodium even if you do not use salt.  Why Limit Sodium? A build up of sodium in the body can cause thirst, increased blood pressure, shortness of breath, and water retention.  Decreasing sodium in the diet can reduce edema and risk of heart attack or stroke associated with high blood pressure.  Keep in mind that there are many other factors involved in these health problems.  Heredity, obesity, lack of exercise, cigarette smoking, stress and what you eat all play a role.  General Guidelines: Do not add salt at the table or in cooking.  One teaspoon of salt contains over 2 grams of sodium. Read food labels Avoid processed and convenience foods Ask your dietitian before eating any foods not dicussed in the menu planning guidelines Consult your physician if you wish to use a salt substitute or a sodium containing medication such as antacids.  Limit milk and milk products to 16 oz (2 cups) per day.  Shopping Hints: READ LABELS!! "Dietetic" does not necessarily mean low sodium. Salt and other sodium ingredients are often added to foods during  processing.    Menu Planning Guidelines Food Group Choose More Often Avoid  Beverages (see also the milk group All fruit juices, low-sodium, salt-free vegetables juices, low-sodium carbonated beverages Regular vegetable or tomato juices, commercially softened water used for drinking or cooking  Breads and Cereals Enriched white, wheat, rye and pumpernickel bread, hard rolls and dinner rolls; muffins, cornbread and waffles; most dry cereals, cooked cereal without added salt; unsalted crackers and breadsticks; low sodium or homemade bread crumbs Bread, rolls and crackers with salted tops; quick breads; instant hot cereals; pancakes; commercial bread stuffing; self-rising flower and biscuit mixes; regular bread crumbs or cracker crumbs  Desserts and Sweets Desserts and sweets mad with mild should be within allowance Instant pudding mixes and cake mixes  Fats Butter or margarine; vegetable oils; unsalted salad dressings, regular salad dressings limited to 1 Tbs; light, sour and heavy cream Regular salad dressings containing bacon fat, bacon bits, and salt pork; snack dips made with instant soup mixes or processed cheese; salted nuts  Fruits Most fresh, frozen and canned fruits Fruits processed with salt or sodium-containing ingredient (some dried fruits are processed with sodium sulfites        Vegetables Fresh, frozen vegetables and low- sodium canned vegetables Regular canned vegetables, sauerkraut, pickled vegetables, and others prepared in brine; frozen vegetables in sauces; vegetables seasoned with ham, bacon or salt pork  Condiments, Sauces, Miscellaneous  Salt substitute with physician's approval; pepper, herbs, spices; vinegar, lemon or lime juice; hot pepper sauce; garlic powder,  onion powder, low sodium soy sauce (1 Tbs.); low sodium condiments (ketchup, chili sauce, mustard) in limited amounts (1 tsp.) fresh ground horseradish; unsalted tortilla chips, pretzels, potato chips, popcorn, salsa  (1/4 cup) Any seasoning made with salt including garlic salt, celery salt, onion salt, and seasoned salt; sea salt, rock salt, kosher salt; meat tenderizers; monosodium glutamate; mustard, regular soy sauce, barbecue, sauce, chili sauce, teriyaki sauce, steak sauce, Worcestershire sauce, and most flavored vinegars; canned gravy and mixes; regular condiments; salted snack foods, olives, picles, relish, horseradish sauce, catsup   Food preparation: Try these seasonings Meats:    Pork Sage, onion Serve with applesauce  Chicken Poultry seasoning, thyme, parsley Serve with cranberry sauce  Lamb Curry powder, rosemary, garlic, thyme Serve with mint sauce or jelly  Veal Marjoram, basil Serve with current jelly, cranberry sauce  Beef Pepper, bay leaf Serve with dry mustard, unsalted chive butter  Fish [place] leaf, dill Serve with unsalted lemon butter, unsalted parsley butter  Vegetables:    Asparagus Lemon juice   Broccoli Lemon juice   Carrots Mustard dressing parsley, mint, nutmeg, glazed with unsalted butter and sugar   Green beans Marjoram, lemon juice, nutmeg,dill seed   Tomatoes Basil, marjoram, onion   Spice /blend for [employer]" 4 tsp ground thyme 1 tsp ground sage 3 tsp ground rosemary 4 tsp ground marjoram   Test your knowledge A product that says "Salt Free" may still contain sodium. True or False Garlic Powder and Hot Pepper Sauce an be used as alternative seasonings.True or False Processed foods have more sodium than fresh foods.  True or False Canned Vegetables have less sodium than froze True or False   WAYS TO DECREASE YOUR SODIUM INTAKE Avoid the use of added salt in cooking and at the table.  Table salt (and other prepared seasonings which contain salt) is probably one of the greatest sources of sodium in the diet.  Unsalted foods can gain flavor from the sweet, sour, and butter taste sensations of herbs and spices.  Instead of using salt for seasoning, try the following  seasonings with the foods listed.  Remember: how you use them to enhance natural food flavors is limited only by your creativity... Allspice-Meat, fish, eggs, fruit, peas, red and yellow vegetables Almond Extract-Fruit baked goods Anise Seed-Sweet breads, fruit, carrots, beets, cottage cheese, cookies (tastes like licorice) Basil-Meat, fish, eggs, vegetables, rice, vegetables salads, soups, sauces Bay Leaf-Meat, fish, stews, poultry Burnet-Salad, vegetables (cucumber-like flavor) Caraway Seed-Bread, cookies, cottage cheese, meat, vegetables, cheese, rice Cardamon-Baked goods, fruit, soups Celery Powder or seed-Salads, salad dressings, sauces, meatloaf, soup, bread.Do not use  celery salt Chervil-Meats, salads, fish, eggs, vegetables, cottage cheese (parsley-like flavor) Chili Power-Meatloaf, chicken cheese, corn, eggplant, egg dishes Chives-Salads cottage cheese, egg dishes, soups, vegetables, sauces Cilantro-Salsa, casseroles Cinnamon-Baked goods, fruit, pork, lamb, chicken, carrots Cloves-Fruit, baked goods, fish, pot roast, green beans, beets, carrots Coriander-Pastry, cookies, meat, salads, cheese (lemon-orange flavor) Cumin-Meatloaf, fish,cheese, eggs, cabbage,fruit pie (caraway flavor) [employer], fruit, eggs, fish, poultry, cottage cheese, vegetables Dill Seed-Meat, cottage cheese, poultry, vegetables, fish, salads, bread Fennel Seed-Bread, cookies, apples, pork, eggs, fish, beets, cabbage, cheese, Licorice-like flavor Garlic-(buds or powder) Salads, meat, poultry, fish, bread, butter, vegetables, potatoes.Do not  use garlic salt Ginger-Fruit, vegetables, baked goods, meat, fish, poultry Horseradish Root-Meet, vegetables, butter Lemon Juice or Extract-Vegetables, fruit, tea, baked goods, fish salads Mace-Baked goods fruit, vegetables, fish, poultry (taste like nutmeg) Maple Extract-Syrups Marjoram-Meat, chicken, fish, vegetables, breads, green salads (taste like  Sage) Mint-Tea, lamb, sherbet,  vegetables, desserts, carrots, cabbage Mustard, Dry or Seed-Cheese, eggs, meats, vegetables, poultry Nutmeg-Baked goods, fruit, chicken, eggs, vegetables, desserts Onion Powder-Meat, fish, poultry, vegetables, cheese, eggs, bread, rice salads (Do not use   Onion salt) Orange Extract-Desserts, baked goods Oregano-Pasta, eggs, cheese, onions, pork, lamb, fish, chicken, vegetables, green salads Paprika-Meat, fish, poultry, eggs, cheese, vegetables Parsley Flakes-Butter, vegetables, meat fish, poultry, eggs, bread, salads (certain forms may   Contain sodium Pepper-Meat fish, poultry, vegetables, eggs Peppermint Extract-Desserts, baked goods Poppy Seed-Eggs, bread, cheese, fruit dressings, baked goods, noodles, vegetables, cottage  [employer], poultry, meat, fish, cauliflower, turnips,eggs bread Saffron-Rice, bread, veal, chicken, fish, eggs Sage-Meat, fish, poultry, onions, eggplant, tomateos, pork, stews Savory-Eggs, salads, poultry, meat, rice, vegetables, soups, pork Tarragon-Meat, poultry, fish, eggs, butter, vegetables (licorice-like flavor)  Thyme-Meat, poultry, fish, eggs, vegetables, (clover-like flavor), sauces, soups Tumeric-Salads, butter, eggs, fish, rice, vegetables (saffron-like flavor) Vanilla Extract-Baked goods, candy Vinegar-Salads, vegetables, meat marinades Walnut Extract-baked goods, candy   2. Choose your Foods Wisely   The following is a list of foods to avoid which are high in sodium:  Meats-Avoid all smoked, canned, salt cured, dried and kosher meat and fish as well as Anchovies   [employer] meats:Bologna, Liverwurst, Pastrami Canned meat or fish  Marinated herring Caviar    Pepperoni Corned Beef   Pizza Dried chipped beef  Salami Frozen breaded fish or meat Salt pork Frankfurters or hot dogs  Sardines Gefilte fish   Sausage Ham (boiled ham, Proscuitto Smoked butt     spiced ham)   Spam      TV Dinners Vegetables Canned vegetables (Regular) Relish Canned mushrooms  Sauerkraut Olives    Tomato juice Pickles  Bakery and Dessert Products Canned puddings  Cream pies Cheesecake   Decorated cakes Cookies  Beverages/Juices Tomato juice, regular  Gatorade   V-8 vegetable juice, regular  Breads and Cereals Biscuit mixes   Salted potato chips, corn chips, pretzels Bread stuffing mixes  Salted crackers and rolls Pancake and waffle mixes Self-rising flour  Seasonings Accent    Meat sauces Barbecue sauce  Meat tenderizer Catsup    Monosodium glutamate (MSG) Celery salt   Onion salt Chili sauce   Prepared mustard Garlic salt   Salt, seasoned salt, sea salt Gravy mixes   Soy sauce Horseradish   Steak sauce Ketchup   Tartar sauce Lite salt    Teriyaki sauce Marinade mixes   Worcestershire sauce  Others Baking powder   Cocoa and cocoa mixes Baking soda   Commercial casserole mixes Candy-caramels, chocolate  Dehydrated soups    Bars, fudge,nougats  Instant rice and pasta mixes Canned broth or soup  Maraschino cherries Cheese, aged and processed cheese and cheese spreads  Learning Assessment Quiz  Indicated T (for True) or F (for False) for each of the following statements:  _____ Fresh fruits and vegetables and unprocessed grains are generally low in sodium _____ Water may contain a considerable amount of sodium, depending on the source _____ You can always tell if a food is high in sodium by tasting it _____ Certain laxatives my be high in sodium and should be avoided unless prescribed   by a physician or pharmacist _____ Salt substitutes may be used freely by anyone on a sodium restricted diet _____ Sodium is present in table salt, food additives and as a natural component of   most foods _____ Table salt is approximately 90% sodium _____ Limiting sodium intake may help prevent excess fluid accumulation  in the body _____ On a  sodium-restricted diet, seasonings such as bouillon soy sauce, and    cooking wine should be used in place of table salt _____ On an ingredient list, a product which lists monosodium glutamate as the first   ingredient is an appropriate food to include on a low sodium diet  Circle the best answer(s) to the following statements (Hint: there may be more than one correct answer)  11. On a low-sodium diet, some acceptable snack items are:    A. Olives  F. Bean dip   K. Grapefruit juice    B. Salted Pretzels G. Commercial Popcorn   L. Canned peaches    C. Carrot Sticks  H. Bouillon   M. Unsalted nuts   D. [place] fries  I. Peanut butter crackers N. Salami   E. Sweet pickles J. Tomato Juice   O. Pizza  12.  Seasonings that may be used freely on a reduced - sodium diet include   A. Lemon wedges F.Monosodium glutamate K. Celery seed    B.Soysauce   G. Pepper   L. Mustard powder   C. Sea salt  H. Cooking wine  M. Onion flakes   D. Vinegar  E. Prepared horseradish N. Salsa   E. Sage   J. Worcestershire sauce  O. Chutney

## 2022-04-10 DIAGNOSIS — S335XXA Sprain of ligaments of lumbar spine, initial encounter: Secondary | ICD-10-CM | POA: Diagnosis not present

## 2022-07-10 ENCOUNTER — Other Ambulatory Visit (INDEPENDENT_AMBULATORY_CARE_PROVIDER_SITE_OTHER): Payer: Self-pay | Admitting: Gastroenterology

## 2022-09-29 IMAGING — MG MM DIGITAL SCREENING BILAT W/ TOMO AND CAD
8 series · 9 of 24 positions shown · non-contrast
Comparison: Previous exam(s).

CLINICAL DATA: Screening.

EXAM:
DIGITAL SCREENING BILATERAL MAMMOGRAM WITH TOMOSYNTHESIS AND CAD
TECHNIQUE: Bilateral screening digital craniocaudal and mediolateral oblique
mammograms were obtained. Bilateral screening digital breast
tomosynthesis was performed. The images were evaluated with
computer-aided detection.

[L MLO synth-2D]
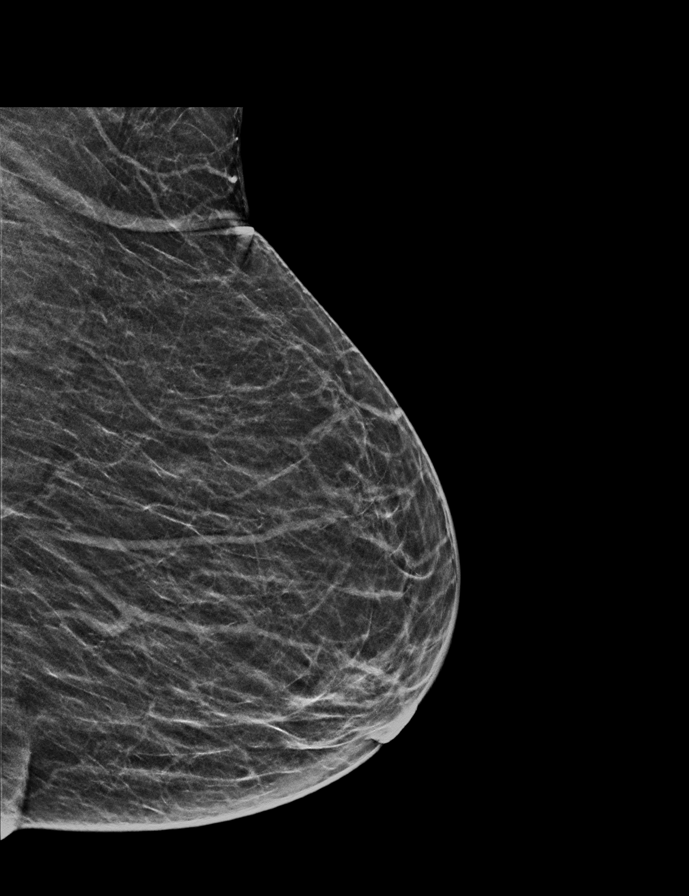

[R MLO synth-2D]
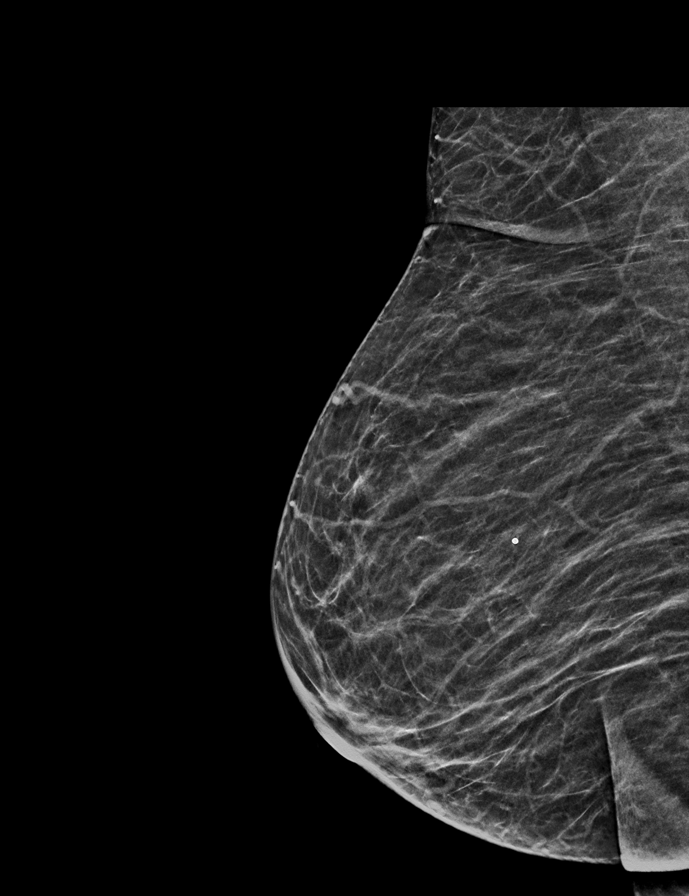

[R CC synth-2D]
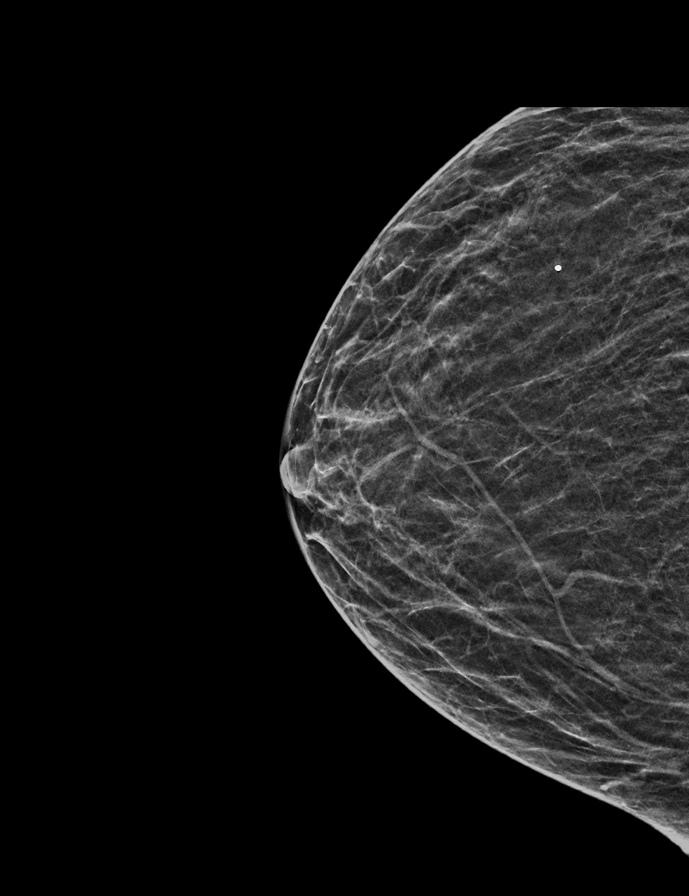

[L CC synth-2D]
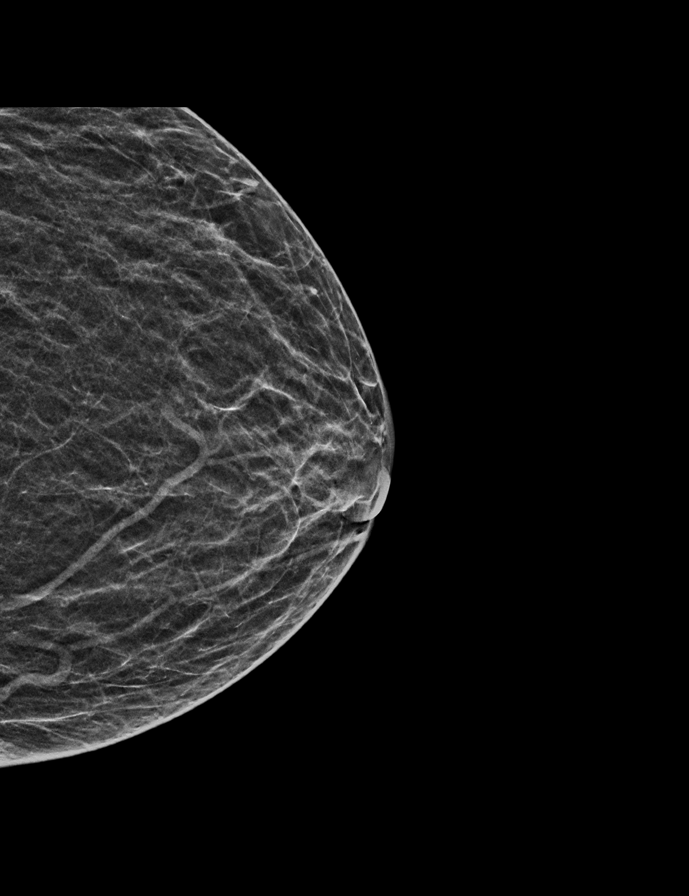

[R CC tomo · 2 of 34 frames shown]
[frame 12/34]
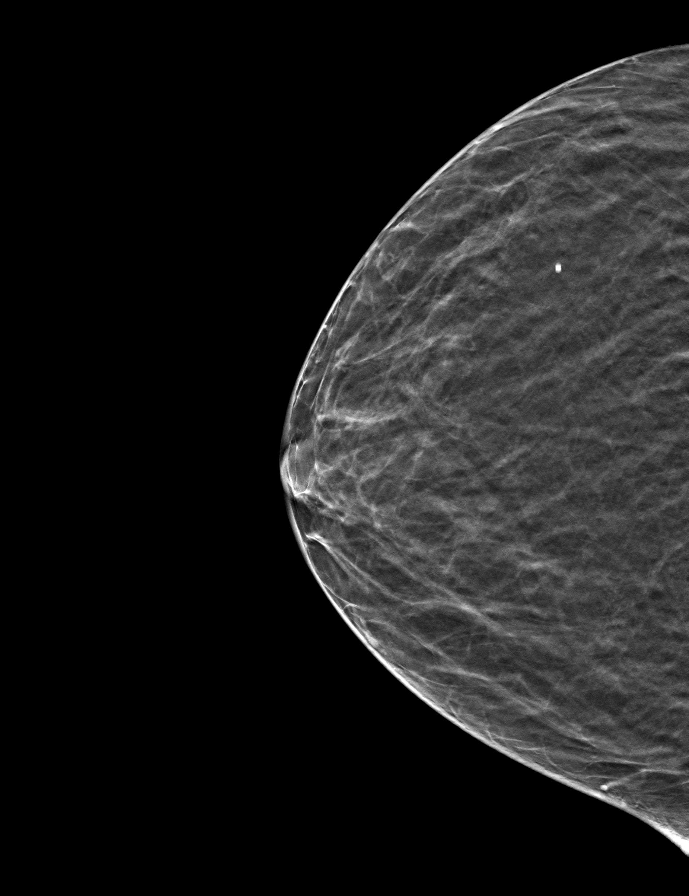
[frame 17/34]
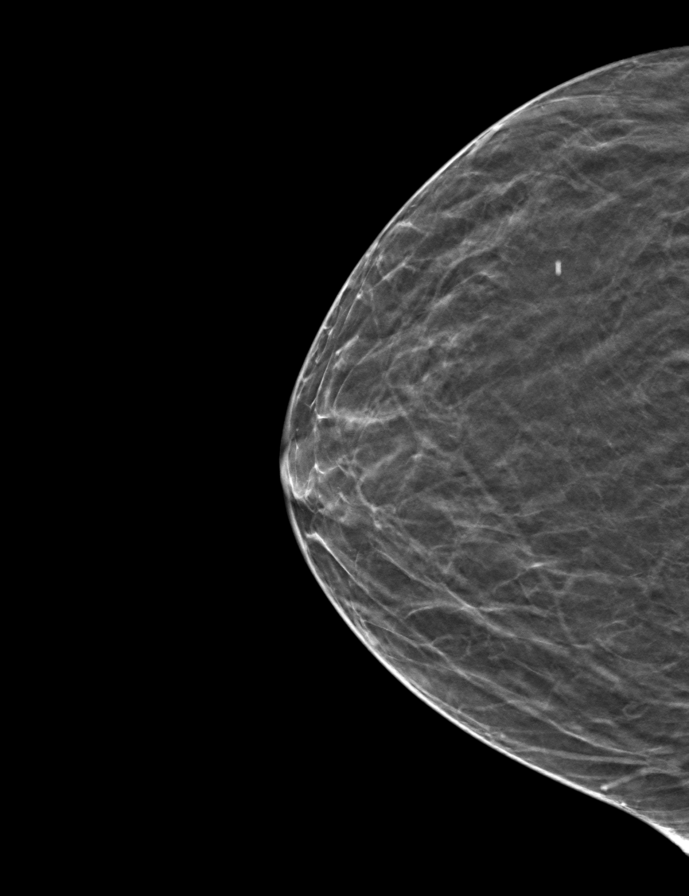

[R MLO tomo · tomo slice 20/39.0]
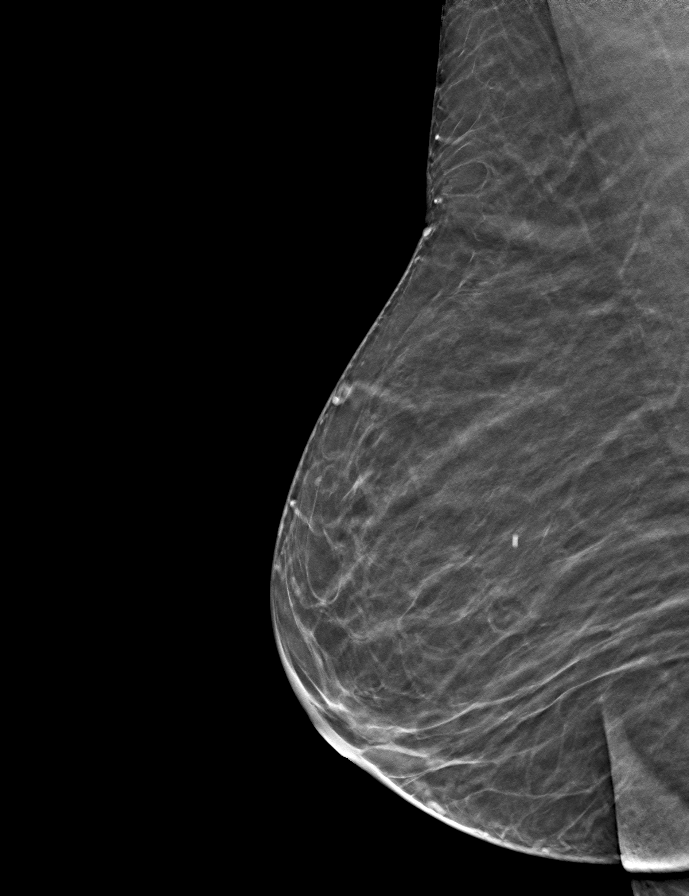

[L MLO tomo · tomo slice 19/36.0]
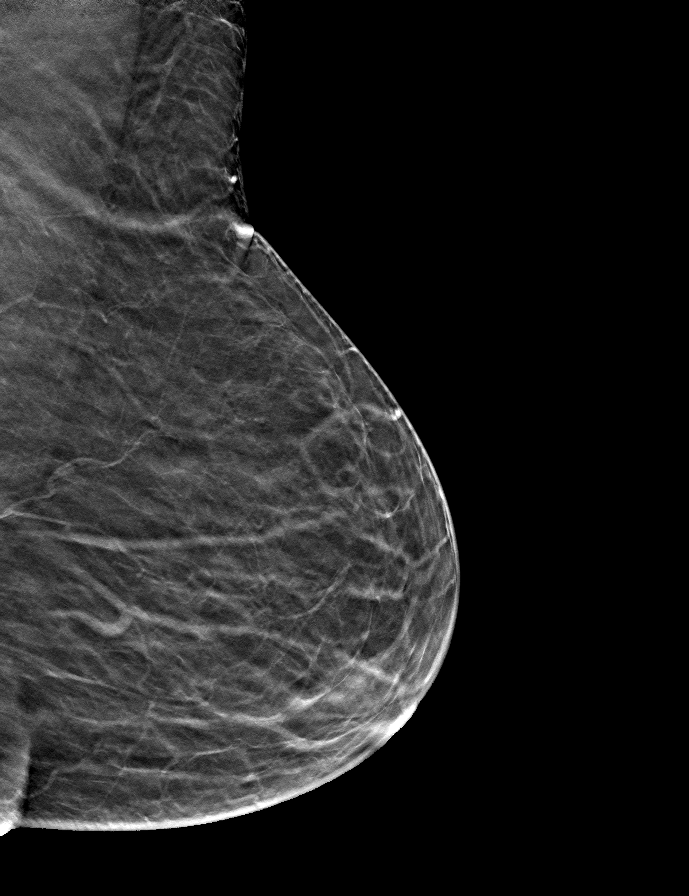

[L CC tomo · tomo slice 17/32.0]
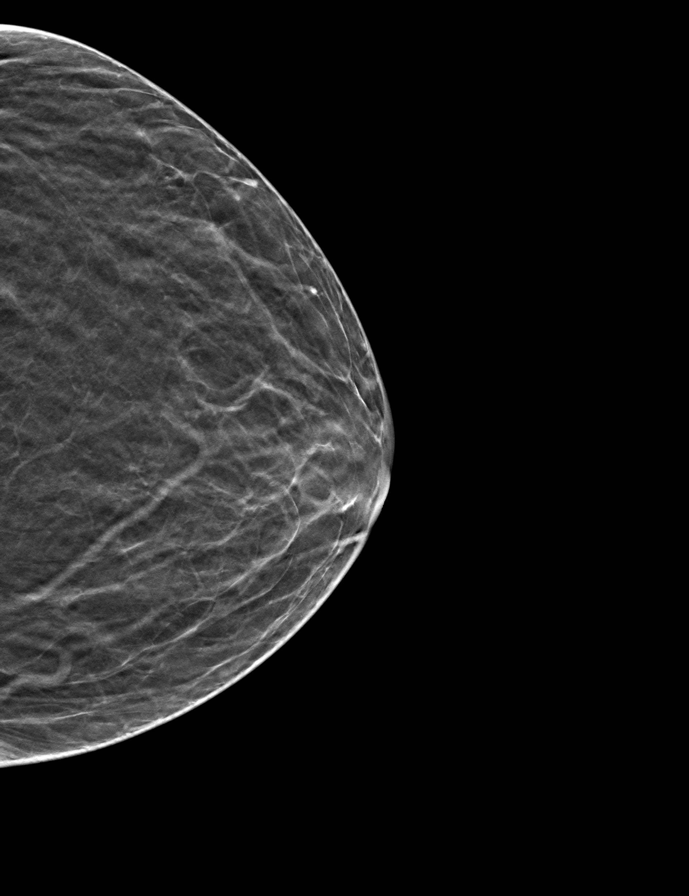

[9 of 24 positions shown; findings below may reference images not displayed]

ACR Breast Density Category b: There are scattered areas of
fibroglandular density.
FINDINGS: There are no findings suspicious for malignancy.
IMPRESSION: No mammographic evidence of malignancy. A result letter of this
screening mammogram will be mailed directly to the patient.

RECOMMENDATION:
Screening mammogram in one year. (Code:51-O-LD2)

BI-RADS CATEGORY  1: Negative.

## 2022-11-15 ENCOUNTER — Other Ambulatory Visit (HOSPITAL_COMMUNITY): Payer: Self-pay | Admitting: Internal Medicine

## 2022-11-15 DIAGNOSIS — Z1231 Encounter for screening mammogram for malignant neoplasm of breast: Secondary | ICD-10-CM

## 2022-11-22 ENCOUNTER — Ambulatory Visit (HOSPITAL_COMMUNITY)
Admission: RE | Admit: 2022-11-22 | Discharge: 2022-11-22 | Disposition: A | Discharge status: Home / Self Care | Payer: Medicare Other | Source: Ambulatory Visit | Attending: Internal Medicine | Admitting: Internal Medicine

## 2022-11-22 DIAGNOSIS — Z1231 Encounter for screening mammogram for malignant neoplasm of breast: Secondary | ICD-10-CM | POA: Diagnosis not present

## 2022-12-31 DIAGNOSIS — Z0001 Encounter for general adult medical examination with abnormal findings: Secondary | ICD-10-CM | POA: Diagnosis not present

## 2022-12-31 DIAGNOSIS — R7309 Other abnormal glucose: Secondary | ICD-10-CM | POA: Diagnosis not present

## 2022-12-31 DIAGNOSIS — I10 Essential (primary) hypertension: Secondary | ICD-10-CM | POA: Diagnosis not present

## 2022-12-31 DIAGNOSIS — E782 Mixed hyperlipidemia: Secondary | ICD-10-CM | POA: Diagnosis not present

## 2022-12-31 DIAGNOSIS — E7849 Other hyperlipidemia: Secondary | ICD-10-CM | POA: Diagnosis not present

## 2022-12-31 DIAGNOSIS — D518 Other vitamin B12 deficiency anemias: Secondary | ICD-10-CM | POA: Diagnosis not present

## 2022-12-31 DIAGNOSIS — K219 Gastro-esophageal reflux disease without esophagitis: Secondary | ICD-10-CM | POA: Diagnosis not present

## 2022-12-31 DIAGNOSIS — E559 Vitamin D deficiency, unspecified: Secondary | ICD-10-CM | POA: Diagnosis not present

## 2022-12-31 DIAGNOSIS — Z029 Encounter for administrative examinations, unspecified: Secondary | ICD-10-CM | POA: Diagnosis not present

## 2022-12-31 DIAGNOSIS — M1990 Unspecified osteoarthritis, unspecified site: Secondary | ICD-10-CM | POA: Diagnosis not present

## 2023-03-13 ENCOUNTER — Ambulatory Visit: Payer: Medicare Other | Attending: Cardiology | Admitting: Cardiology

## 2023-03-13 ENCOUNTER — Encounter: Payer: Self-pay | Admitting: Cardiology

## 2023-03-13 VITALS — BP 128/88 | HR 70 | Ht 64.75 in | Wt 184.0 lb

## 2023-03-13 DIAGNOSIS — R002 Palpitations: Secondary | ICD-10-CM

## 2023-03-13 DIAGNOSIS — E782 Mixed hyperlipidemia: Secondary | ICD-10-CM

## 2023-03-13 DIAGNOSIS — I10 Essential (primary) hypertension: Secondary | ICD-10-CM | POA: Diagnosis not present

## 2023-03-13 NOTE — Progress Notes (Signed)
Clinical Summary Mariah Ross is a 77 y.o.female seen today for follow up of the following medical problems.      1. Palpitations 10/2018 monitor: No significant arrhythmias - no recent palpitations. Symptoms with weaning caffeine.     - EKG today shows SR, first degree AV block - no recent palpitations.    2. GERD - followed by pcp     3. HTN - home bp's typically 120s/70s - compliant with meds, taking her norvasc just prn.    4. Hyperlipidemia - she is on crestor - 12/2022 TC 159 TG 63 HDL 82 LDL 64   SH: 7 grandchildren, 7 great grands.     Past Medical History:  Diagnosis Date   Chronic GERD    Hypertension      No Known Allergies   Current Outpatient Medications  Medication Sig Dispense Refill   amLODipine (NORVASC) 2.5 MG tablet Take 2.5 mg by mouth daily. PRN     famotidine (PEPCID) 20 MG tablet TAKE 1 TABLET BY MOUTH DAILY 100 tablet 2   rosuvastatin (CRESTOR) 10 MG tablet Take 10 mg by mouth at bedtime.     No current facility-administered medications for this visit.     Past Surgical History:  Procedure Laterality Date   SKIN BIOPSY     TONSILLECTOMY     TUBAL LIGATION       No Known Allergies    Family History  Problem Relation Age of Onset   Hypertension Mother    Cancer Father      Social History Ms. Deignan reports that she has never smoked. She has never used smokeless tobacco. Ms. Like reports current alcohol use.   Review of Systems CONSTITUTIONAL: No weight loss, fever, chills, weakness or fatigue.  HEENT: Eyes: No visual loss, blurred vision, double vision or yellow sclerae.No hearing loss, sneezing, congestion, runny nose or sore throat.  SKIN: No rash or itching.  CARDIOVASCULAR: per hpi RESPIRATORY: No shortness of breath, cough or sputum.  GASTROINTESTINAL: No anorexia, nausea, vomiting or diarrhea. No abdominal pain or blood.  GENITOURINARY: No burning on urination, no polyuria NEUROLOGICAL: No headache,  dizziness, syncope, paralysis, ataxia, numbness or tingling in the extremities. No change in bowel or bladder control.  MUSCULOSKELETAL: No muscle, back pain, joint pain or stiffness.  LYMPHATICS: No enlarged nodes. No history of splenectomy.  PSYCHIATRIC: No history of depression or anxiety.  ENDOCRINOLOGIC: No reports of sweating, cold or heat intolerance. No polyuria or polydipsia.  [name]   Physical Examination Today's Vitals   03/13/23 1443  BP: 128/88  Pulse: 70  SpO2: 98%  Weight: 184 lb (83.5 kg)  Height: 5' 4.75" (1.645 m)   Body mass index is 30.86 kg/m.  Gen: resting comfortably, no acute distress HEENT: no scleral icterus, pupils equal round and reactive, no palptable cervical adenopathy,  CV: RRR, 2/6 systolic murmur rusb, no jvd Resp: Clear to auscultation bilaterally GI: abdomen is soft, non-tender, non-distended, normal bowel sounds, no hepatosplenomegaly MSK: extremities are warm, no edema.  Skin: warm, no rash Neuro:  no focal deficits Psych: appropriate affect   Diagnostic Studies  02/2022 echo 1. Left ventricular ejection fraction, by estimation, is 55 to 60%. The  left ventricle has normal function. The left ventricle has no regional  wall motion abnormalities. Left ventricular diastolic parameters are  consistent with Grade I diastolic  dysfunction (impaired relaxation). The average left ventricular global  longitudinal strain is 23.4 %. The global longitudinal strain is normal.  2. Right ventricular systolic function is normal. The right ventricular  size is normal. There is normal pulmonary artery systolic pressure. The  estimated right ventricular systolic pressure is 23.2 mmHg.   3. Left atrial size was upper normal.   4. The mitral valve is grossly normal. Trivial mitral valve  regurgitation.   5. The aortic valve is tricuspid. Aortic valve regurgitation is not  visualized.   6. The inferior vena cava is normal in size with greater than 50%   respiratory variability, suggesting right atrial pressure of 3 mmHg.     Assessment and Plan   1. Palpitations - sypmtoms have improved with weaning of caffeine - no recent symptoms - EKG shows NSR today - continue to monitor   2. HTN - actually just using norvasc prn and has not had to use - bp is at goal, continue current therapy.    3. Hyperlipidemia -LDL at goal, continue current meds   F/u 1 year.      Antoine Poche, M.D.

## 2023-03-13 NOTE — Patient Instructions (Signed)

## 2023-03-20 DIAGNOSIS — Z23 Encounter for immunization: Secondary | ICD-10-CM | POA: Diagnosis not present

## 2023-12-12 ENCOUNTER — Other Ambulatory Visit (HOSPITAL_COMMUNITY): Payer: Self-pay | Admitting: Internal Medicine

## 2023-12-12 DIAGNOSIS — K219 Gastro-esophageal reflux disease without esophagitis: Secondary | ICD-10-CM | POA: Diagnosis not present

## 2023-12-12 DIAGNOSIS — I10 Essential (primary) hypertension: Secondary | ICD-10-CM | POA: Diagnosis not present

## 2023-12-12 DIAGNOSIS — R109 Unspecified abdominal pain: Secondary | ICD-10-CM | POA: Diagnosis not present

## 2023-12-12 DIAGNOSIS — Z0001 Encounter for general adult medical examination with abnormal findings: Secondary | ICD-10-CM | POA: Diagnosis not present

## 2023-12-12 DIAGNOSIS — E559 Vitamin D deficiency, unspecified: Secondary | ICD-10-CM | POA: Diagnosis not present

## 2023-12-17 ENCOUNTER — Encounter (INDEPENDENT_AMBULATORY_CARE_PROVIDER_SITE_OTHER): Payer: Self-pay | Admitting: *Deleted

## 2023-12-18 ENCOUNTER — Ambulatory Visit (INDEPENDENT_AMBULATORY_CARE_PROVIDER_SITE_OTHER): Admitting: Gastroenterology

## 2023-12-18 ENCOUNTER — Encounter (INDEPENDENT_AMBULATORY_CARE_PROVIDER_SITE_OTHER): Payer: Self-pay | Admitting: Gastroenterology

## 2023-12-18 ENCOUNTER — Other Ambulatory Visit (HOSPITAL_COMMUNITY): Payer: Self-pay | Admitting: Internal Medicine

## 2023-12-18 VITALS — BP 125/82 | HR 74 | Temp 97.9°F | Ht 64.5 in | Wt 181.7 lb

## 2023-12-18 DIAGNOSIS — R1013 Epigastric pain: Secondary | ICD-10-CM | POA: Insufficient documentation

## 2023-12-18 DIAGNOSIS — Z1231 Encounter for screening mammogram for malignant neoplasm of breast: Secondary | ICD-10-CM

## 2023-12-18 DIAGNOSIS — K219 Gastro-esophageal reflux disease without esophagitis: Secondary | ICD-10-CM

## 2023-12-18 MED ORDER — PANTOPRAZOLE SODIUM 40 MG PO TBEC: 40.0000 mg | DELAYED_RELEASE_TABLET | Freq: Every day | ORAL | 3 refills | Status: DC

## 2023-12-18 NOTE — Patient Instructions (Signed)
 It was very nice to meet you today, as dicussed with will plan for the following :  1) protonix  40mg , 30 min before breakfast  2) upper endoscopy

## 2023-12-18 NOTE — Progress Notes (Signed)
 Sacha Topor Faizan Karsten Vaughn , M.D. Gastroenterology & Hepatology [facility] [address] [place], [place] [address] Primary Care Physician: Bertell Satterfield, MD [address] [place] [address]  Chief Complaint: Epigastric pain ,dyspepsia  History of Present Illness: Mariah Ross is a 78 y.o. female with hypertension who presents for evaluation of Epigastric pain and dyspepsia.  Patient was last seen in our clinic in 2022 .  Patient reports the past couple weeks she is feeling epigastric discomfort mostly on empty stomach.  Patient reports fullness in epigastric and upper chest area after eating .Patient has been ordered for CT abdomen pelvis with IV contrast by PCP. No NSAID use The patient denies having any nausea, vomiting, fever, chills, hematochezia, melena, hematemesis, diarrhea, jaundice, pruritus or weight loss.  Last labs from 04/10/2024 hemoglobin 12.4 platelet 186 normal liver enzymes  Vitamin B12 and folate normal Bibasilarly Last Colonoscopy:Dr. Fields, 2011, normal, internal hemorrhoids, cologuard negative in early 2022.  Last Endoscopy:n/a  FHx: neg for any gastrointestinal/liver disease, no malignancies Social: neg smoking, alcohol or illicit drug use Surgical: no abdominal surgeries  Past Medical History: Past Medical History:  Diagnosis Date   Chronic GERD    Hypertension     Past Surgical History: Past Surgical History:  Procedure Laterality Date   SKIN BIOPSY     TONSILLECTOMY     TUBAL LIGATION      Family History: Family History  Problem Relation Age of Onset   Hypertension Mother    Cancer Father     Social History: Social History   Tobacco Use  Smoking Status Never  Smokeless Tobacco Never   Social History   Substance and Sexual Activity  Alcohol Use Yes   Comment: socially - wine   Social History   Substance and Sexual Activity  Drug Use No    Allergies: No Known  Allergies  Medications: Current Outpatient Medications  Medication Sig Dispense Refill   amLODipine (NORVASC) 2.5 MG tablet Take 2.5 mg by mouth daily. PRN     Cholecalciferol (VITAMIN D) 50 MCG (2000 UT) CAPS Take 1 capsule by mouth daily.     rosuvastatin (CRESTOR) 10 MG tablet Take 10 mg by mouth at bedtime.     Coenzyme Q10 (COQ10) 100 MG CAPS Take 1 tablet by mouth daily. (Patient not taking: Reported on 12/18/2023)     famotidine  (PEPCID ) 20 MG tablet TAKE 1 TABLET BY MOUTH DAILY (Patient not taking: Reported on 12/18/2023) 100 tablet 2   Misc Natural Products (GLUCOSAMINE CHOND CMP TRIPLE PO) Take 1 tablet by mouth daily. (Patient not taking: Reported on 12/18/2023)     TURMERIC-GINGER PO Take 500 mg by mouth daily. (Patient not taking: Reported on 12/18/2023)     No current facility-administered medications for this visit.    Review of Systems: GENERAL: negative for malaise, night sweats HEENT: No changes in hearing or vision, no nose bleeds or other nasal problems. NECK: Negative for lumps, goiter, pain and significant neck swelling RESPIRATORY: Negative for cough, wheezing CARDIOVASCULAR: Negative for chest pain, leg swelling, palpitations, orthopnea GI: SEE HPI MUSCULOSKELETAL: Negative for joint pain or swelling, back pain, and muscle pain. SKIN: Negative for lesions, rash HEMATOLOGY Negative for prolonged bleeding, bruising easily, and swollen nodes. ENDOCRINE: Negative for cold or heat intolerance, polyuria, polydipsia and goiter. NEURO: negative for tremor, gait imbalance, syncope and seizures. The remainder of the review of systems is noncontributory.   Physical Exam: BP 125/82   Pulse 74  Temp 97.9 F (36.6 C)   Ht 5' 4.5 (1.638 m)   Wt 181 lb 11.2 oz (82.4 kg)   BMI 30.71 kg/m  GENERAL: The patient is AO x3, in no acute distress. HEENT: Head is normocephalic and atraumatic. EOMI are intact. Mouth is well hydrated and without lesions. NECK: Supple. No  masses LUNGS: Clear to auscultation. No presence of rhonchi/wheezing/rales. Adequate chest expansion HEART: RRR, normal s1 and s2. ABDOMEN: Soft, nontender, no guarding, no peritoneal signs, and nondistended. BS +. No masses.  Imaging/Labs: as above     Latest Ref Rng & Units 10/01/2017   10:29 AM 11/12/2016    6:53 PM 06/09/2015    5:48 PM  CBC  WBC 4.0 - 10.5 K/uL 5.4  4.8  4.7   Hemoglobin 12.0 - 15.0 g/dL 88.1  88.6  88.0   Hematocrit 36.0 - 46.0 % 37.5  34.6  37.0   Platelets 150 - 400 K/uL 193  152  203    No results found for: IRON, TIBC, FERRITIN  I personally reviewed and interpreted the available labs, imaging and endoscopic files.   IMPRESSION: No acute findings or significant abnormality.    Impression and Plan:  Mariah Ross is a 78 y.o. female with hypertension who presents for evaluation of Epigastric pain and dyspepsia.  #Dyspepsia  Patient has epigastric discomfort mostly on empty stomach could be peptic ulcer disease This could be acid mediated or H. pylori mediated dyspepsia  Although new  upper GI symptoms in patient above the age 38 is considered alarm symptom symptom and indication for diagnostic upper endoscopy  Upper endoscopy  CT abdomen pelvis already ordered by PCP  Protonix  30 minutes before breakfast  I thoroughly discussed with the patient the procedure, including the risks involved. Patient understands what the procedure involves including the benefits and any risks. Patient understands alternatives to the proposed procedure. Risks including (but not limited to) bleeding, tearing of the lining (perforation), rupture of adjacent organs, problems with heart and lung function, infection, and medication reactions. A small percentage of complications may require surgery, hospitalization, repeat endoscopic procedure, and/or transfusion.  Patient understood and agreed.   All questions were answered.      Toshua Honsinger Faizan Kionna Brier,  MD Gastroenterology and Hepatology [facility]   This chart has been completed using [facility] Dictation software, and while attempts have been made to ensure accuracy , certain words and phrases may not be transcribed as intended

## 2023-12-18 NOTE — H&P (View-Only) (Signed)
 Tremont Gavitt Faizan Keifer Habib , M.D. Gastroenterology & Hepatology [facility] [address] [place], [place] [address] Primary Care Physician: Bertell Satterfield, MD [address] [place] [address]  Chief Complaint: Epigastric pain ,dyspepsia  History of Present Illness: Mariah Ross is a 78 y.o. female with hypertension who presents for evaluation of Epigastric pain and dyspepsia.  Patient was last seen in our clinic in 2022 .  Patient reports the past couple weeks she is feeling epigastric discomfort mostly on empty stomach.  Patient reports fullness in epigastric and upper chest area after eating .Patient has been ordered for CT abdomen pelvis with IV contrast by PCP. No NSAID use The patient denies having any nausea, vomiting, fever, chills, hematochezia, melena, hematemesis, diarrhea, jaundice, pruritus or weight loss.  Last labs from 04/10/2024 hemoglobin 12.4 platelet 186 normal liver enzymes  Vitamin B12 and folate normal Bibasilarly Last Colonoscopy:Dr. Fields, 2011, normal, internal hemorrhoids, cologuard negative in early 2022.  Last Endoscopy:n/a  FHx: neg for any gastrointestinal/liver disease, no malignancies Social: neg smoking, alcohol or illicit drug use Surgical: no abdominal surgeries  Past Medical History: Past Medical History:  Diagnosis Date   Chronic GERD    Hypertension     Past Surgical History: Past Surgical History:  Procedure Laterality Date   SKIN BIOPSY     TONSILLECTOMY     TUBAL LIGATION      Family History: Family History  Problem Relation Age of Onset   Hypertension Mother    Cancer Father     Social History: Social History   Tobacco Use  Smoking Status Never  Smokeless Tobacco Never   Social History   Substance and Sexual Activity  Alcohol Use Yes   Comment: socially - wine   Social History   Substance and Sexual Activity  Drug Use No    Allergies: No Known  Allergies  Medications: Current Outpatient Medications  Medication Sig Dispense Refill   amLODipine (NORVASC) 2.5 MG tablet Take 2.5 mg by mouth daily. PRN     Cholecalciferol (VITAMIN D) 50 MCG (2000 UT) CAPS Take 1 capsule by mouth daily.     rosuvastatin (CRESTOR) 10 MG tablet Take 10 mg by mouth at bedtime.     Coenzyme Q10 (COQ10) 100 MG CAPS Take 1 tablet by mouth daily. (Patient not taking: Reported on 12/18/2023)     famotidine  (PEPCID ) 20 MG tablet TAKE 1 TABLET BY MOUTH DAILY (Patient not taking: Reported on 12/18/2023) 100 tablet 2   Misc Natural Products (GLUCOSAMINE CHOND CMP TRIPLE PO) Take 1 tablet by mouth daily. (Patient not taking: Reported on 12/18/2023)     TURMERIC-GINGER PO Take 500 mg by mouth daily. (Patient not taking: Reported on 12/18/2023)     No current facility-administered medications for this visit.    Review of Systems: GENERAL: negative for malaise, night sweats HEENT: No changes in hearing or vision, no nose bleeds or other nasal problems. NECK: Negative for lumps, goiter, pain and significant neck swelling RESPIRATORY: Negative for cough, wheezing CARDIOVASCULAR: Negative for chest pain, leg swelling, palpitations, orthopnea GI: SEE HPI MUSCULOSKELETAL: Negative for joint pain or swelling, back pain, and muscle pain. SKIN: Negative for lesions, rash HEMATOLOGY Negative for prolonged bleeding, bruising easily, and swollen nodes. ENDOCRINE: Negative for cold or heat intolerance, polyuria, polydipsia and goiter. NEURO: negative for tremor, gait imbalance, syncope and seizures. The remainder of the review of systems is noncontributory.   Physical Exam: BP 125/82   Pulse 74  Temp 97.9 F (36.6 C)   Ht 5' 4.5 (1.638 m)   Wt 181 lb 11.2 oz (82.4 kg)   BMI 30.71 kg/m  GENERAL: The patient is AO x3, in no acute distress. HEENT: Head is normocephalic and atraumatic. EOMI are intact. Mouth is well hydrated and without lesions. NECK: Supple. No  masses LUNGS: Clear to auscultation. No presence of rhonchi/wheezing/rales. Adequate chest expansion HEART: RRR, normal s1 and s2. ABDOMEN: Soft, nontender, no guarding, no peritoneal signs, and nondistended. BS +. No masses.  Imaging/Labs: as above     Latest Ref Rng & Units 10/01/2017   10:29 AM 11/12/2016    6:53 PM 06/09/2015    5:48 PM  CBC  WBC 4.0 - 10.5 K/uL 5.4  4.8  4.7   Hemoglobin 12.0 - 15.0 g/dL 88.1  88.6  88.0   Hematocrit 36.0 - 46.0 % 37.5  34.6  37.0   Platelets 150 - 400 K/uL 193  152  203    No results found for: IRON, TIBC, FERRITIN  I personally reviewed and interpreted the available labs, imaging and endoscopic files.   IMPRESSION: No acute findings or significant abnormality.    Impression and Plan:  Mariah Ross is a 78 y.o. female with hypertension who presents for evaluation of Epigastric pain and dyspepsia.  #Dyspepsia  Patient has epigastric discomfort mostly on empty stomach could be peptic ulcer disease This could be acid mediated or H. pylori mediated dyspepsia  Although new  upper GI symptoms in patient above the age 41 is considered alarm symptom symptom and indication for diagnostic upper endoscopy  Upper endoscopy  CT abdomen pelvis already ordered by PCP  Protonix  30 minutes before breakfast  I thoroughly discussed with the patient the procedure, including the risks involved. Patient understands what the procedure involves including the benefits and any risks. Patient understands alternatives to the proposed procedure. Risks including (but not limited to) bleeding, tearing of the lining (perforation), rupture of adjacent organs, problems with heart and lung function, infection, and medication reactions. A small percentage of complications may require surgery, hospitalization, repeat endoscopic procedure, and/or transfusion.  Patient understood and agreed.   All questions were answered.      Quianna Avery Faizan Evia Goldsmith,  MD Gastroenterology and Hepatology [facility]   This chart has been completed using [facility] Dictation software, and while attempts have been made to ensure accuracy , certain words and phrases may not be transcribed as intended

## 2023-12-19 ENCOUNTER — Telehealth: Payer: Self-pay | Admitting: *Deleted

## 2023-12-19 ENCOUNTER — Ambulatory Visit (HOSPITAL_COMMUNITY)
Admission: RE | Admit: 2023-12-19 | Discharge: 2023-12-19 | Disposition: A | Discharge status: Home / Self Care | Source: Ambulatory Visit | Attending: Internal Medicine | Admitting: Internal Medicine

## 2023-12-19 ENCOUNTER — Other Ambulatory Visit: Payer: Self-pay | Admitting: *Deleted

## 2023-12-19 ENCOUNTER — Encounter: Payer: Self-pay | Admitting: *Deleted

## 2023-12-19 DIAGNOSIS — Z1231 Encounter for screening mammogram for malignant neoplasm of breast: Secondary | ICD-10-CM | POA: Insufficient documentation

## 2023-12-19 NOTE — Telephone Encounter (Signed)
 Spoke with pt, procedure scheduled and instructions mailed

## 2024-01-01 DIAGNOSIS — E782 Mixed hyperlipidemia: Secondary | ICD-10-CM | POA: Diagnosis not present

## 2024-01-01 DIAGNOSIS — I10 Essential (primary) hypertension: Secondary | ICD-10-CM | POA: Diagnosis not present

## 2024-01-01 DIAGNOSIS — E039 Hypothyroidism, unspecified: Secondary | ICD-10-CM | POA: Diagnosis not present

## 2024-01-01 DIAGNOSIS — D518 Other vitamin B12 deficiency anemias: Secondary | ICD-10-CM | POA: Diagnosis not present

## 2024-01-01 DIAGNOSIS — E559 Vitamin D deficiency, unspecified: Secondary | ICD-10-CM | POA: Diagnosis not present

## 2024-01-01 DIAGNOSIS — Z0001 Encounter for general adult medical examination with abnormal findings: Secondary | ICD-10-CM | POA: Diagnosis not present

## 2024-01-09 ENCOUNTER — Encounter (HOSPITAL_COMMUNITY)
Admission: RE | Admit: 2024-01-09 | Discharge: 2024-01-09 | Disposition: A | Discharge status: Home / Self Care | Source: Ambulatory Visit | Attending: Gastroenterology | Admitting: Gastroenterology

## 2024-01-10 NOTE — Pre-Procedure Instructions (Signed)
Attempted preop phone call. No answer and no VM.

## 2024-01-13 ENCOUNTER — Other Ambulatory Visit: Payer: Self-pay

## 2024-01-13 ENCOUNTER — Encounter (HOSPITAL_COMMUNITY): Payer: Self-pay

## 2024-01-15 NOTE — Anesthesia Preprocedure Evaluation (Addendum)
 Anesthesia Evaluation  Patient identified by MRN, date of birth, ID band Patient awake    Reviewed: Allergy & Precautions, H&P , NPO status , Patient's Chart, lab work & pertinent test results, reviewed documented beta blocker date and time   Airway Mallampati: II  TM Distance: >3 FB Neck ROM: full    Dental  (+) Dental Advisory Given, Missing Several missing upper front teeth:   Pulmonary neg pulmonary ROS   Pulmonary exam normal breath sounds clear to auscultation       Cardiovascular hypertension, Normal cardiovascular exam Rhythm:regular Rate:Normal     Neuro/Psych negative neurological ROS  negative psych ROS   GI/Hepatic Neg liver ROS,GERD  ,,  Endo/Other  negative endocrine ROS    Renal/GU negative Renal ROS  negative genitourinary   Musculoskeletal   Abdominal   Peds  Hematology negative hematology ROS (+)   Anesthesia Other Findings   Reproductive/Obstetrics negative OB ROS                              Anesthesia Physical Anesthesia Plan  ASA: 2  Anesthesia Plan: General   Post-op Pain Management: Minimal or no pain anticipated   Induction: Intravenous  PONV Risk Score and Plan: Propofol  infusion  Airway Management Planned: Natural Airway and Nasal Cannula  Additional Equipment: None  Intra-op Plan:   Post-operative Plan:   Informed Consent: I have reviewed the patients History and Physical, chart, labs and discussed the procedure including the risks, benefits and alternatives for the proposed anesthesia with the patient or authorized representative who has indicated his/her understanding and acceptance.     Dental Advisory Given  Plan Discussed with: CRNA  Anesthesia Plan Comments:          Anesthesia Quick Evaluation

## 2024-01-16 ENCOUNTER — Encounter (HOSPITAL_COMMUNITY): Payer: Self-pay | Admitting: Gastroenterology

## 2024-01-16 ENCOUNTER — Ambulatory Visit (HOSPITAL_BASED_OUTPATIENT_CLINIC_OR_DEPARTMENT_OTHER): Payer: Self-pay | Admitting: Anesthesiology

## 2024-01-16 ENCOUNTER — Ambulatory Visit (HOSPITAL_COMMUNITY): Payer: Self-pay | Admitting: Anesthesiology

## 2024-01-16 ENCOUNTER — Ambulatory Visit (HOSPITAL_COMMUNITY)
Admission: RE | Admit: 2024-01-16 | Discharge: 2024-01-16 | Disposition: A | Discharge status: Home / Self Care | Attending: Gastroenterology | Admitting: Gastroenterology

## 2024-01-16 ENCOUNTER — Encounter (HOSPITAL_COMMUNITY)
Admission: RE | Disposition: A | Discharge status: Home / Self Care | Payer: Self-pay | Source: Home / Self Care | Attending: Gastroenterology

## 2024-01-16 DIAGNOSIS — K2980 Duodenitis without bleeding: Secondary | ICD-10-CM | POA: Diagnosis not present

## 2024-01-16 DIAGNOSIS — K31A19 Gastric intestinal metaplasia without dysplasia, unspecified site: Secondary | ICD-10-CM

## 2024-01-16 DIAGNOSIS — K2100 Gastro-esophageal reflux disease with esophagitis, without bleeding: Secondary | ICD-10-CM | POA: Diagnosis not present

## 2024-01-16 DIAGNOSIS — K279 Peptic ulcer, site unspecified, unspecified as acute or chronic, without hemorrhage or perforation: Secondary | ICD-10-CM

## 2024-01-16 DIAGNOSIS — R1013 Epigastric pain: Secondary | ICD-10-CM | POA: Diagnosis not present

## 2024-01-16 DIAGNOSIS — I10 Essential (primary) hypertension: Secondary | ICD-10-CM | POA: Insufficient documentation

## 2024-01-16 DIAGNOSIS — K3189 Other diseases of stomach and duodenum: Secondary | ICD-10-CM

## 2024-01-16 DIAGNOSIS — K648 Other hemorrhoids: Secondary | ICD-10-CM | POA: Insufficient documentation

## 2024-01-16 DIAGNOSIS — Z79899 Other long term (current) drug therapy: Secondary | ICD-10-CM | POA: Diagnosis not present

## 2024-01-16 DIAGNOSIS — K269 Duodenal ulcer, unspecified as acute or chronic, without hemorrhage or perforation: Secondary | ICD-10-CM | POA: Diagnosis not present

## 2024-01-16 DIAGNOSIS — K2970 Gastritis, unspecified, without bleeding: Secondary | ICD-10-CM

## 2024-01-16 HISTORY — PX: ESOPHAGOGASTRODUODENOSCOPY: SHX5428

## 2024-01-16 SURGERY — EGD (ESOPHAGOGASTRODUODENOSCOPY)
Anesthesia: General

## 2024-01-16 MED ORDER — LIDOCAINE 2% (20 MG/ML) 5 ML SYRINGE
INTRAMUSCULAR | Status: DC | PRN
  Administered 2024-01-16: 80 mg via INTRAVENOUS

## 2024-01-16 MED ORDER — LACTATED RINGERS IV SOLN: INTRAVENOUS | Status: DC | PRN

## 2024-01-16 MED ORDER — PROPOFOL 10 MG/ML IV BOLUS
INTRAVENOUS | Status: DC | PRN
  Administered 2024-01-16 (×2): 50 mg via INTRAVENOUS

## 2024-01-16 MED ORDER — LACTATED RINGERS IV SOLN: INTRAVENOUS | Status: DC

## 2024-01-16 MED ORDER — PROPOFOL 500 MG/50ML IV EMUL
INTRAVENOUS | Status: DC | PRN
  Administered 2024-01-16: 100 ug/kg/min via INTRAVENOUS

## 2024-01-16 NOTE — Anesthesia Postprocedure Evaluation (Signed)
 Anesthesia Post Note  Patient: Mariah Ross  Procedure(s) Performed: EGD (ESOPHAGOGASTRODUODENOSCOPY)  Patient location during evaluation: Endoscopy Anesthesia Type: General Level of consciousness: awake and alert Pain management: pain level controlled Vital Signs Assessment: post-procedure vital signs reviewed and stable Respiratory status: spontaneous breathing, nonlabored ventilation and respiratory function stable Cardiovascular status: blood pressure returned to baseline and stable Postop Assessment: no apparent nausea or vomiting Anesthetic complications: no   There were no known notable events for this encounter.   Last Vitals:  Vitals:   01/16/24 0802 01/16/24 0810  BP: (P) 92/72 103/71  Pulse:  70  Resp:  15  Temp: (P) 36.5 C 36.5 C  SpO2: (P) 100% 100%    Last Pain:  Vitals:   01/16/24 0810  TempSrc: Oral  PainSc: 0-No pain                 Mariah Ross

## 2024-01-16 NOTE — Transfer of Care (Signed)
 Immediate Anesthesia Transfer of Care Note  Patient: Mariah Ross  Procedure(s) Performed: EGD (ESOPHAGOGASTRODUODENOSCOPY)  Patient Location: PACU  Anesthesia Type:MAC  Level of Consciousness: awake and alert   Airway & Oxygen Therapy: Patient Spontanous Breathing and Patient connected to nasal cannula oxygen  Post-op Assessment: Report given to RN and Post -op Vital signs reviewed and stable  Post vital signs: Reviewed and stable  Last Vitals:  Vitals Value Taken Time  BP    Temp    Pulse    Resp    SpO2      Last Pain:  Vitals:   01/16/24 0802  TempSrc: (P) Oral  PainSc:          Complications: No notable events documented.

## 2024-01-16 NOTE — Discharge Instructions (Signed)

## 2024-01-16 NOTE — Interval H&P Note (Signed)
 History and Physical Interval Note:  01/16/2024 7:38 AM  Mariah Ross  has presented today for surgery, with the diagnosis of dyspepsia.  The various methods of treatment have been discussed with the patient and family. After consideration of risks, benefits and other options for treatment, the patient has consented to  Procedure(s) with comments: EGD (ESOPHAGOGASTRODUODENOSCOPY) (N/A) - 730am, asa 1-2 as a surgical intervention.  The patient's history has been reviewed, patient examined, no change in status, stable for surgery.  I have reviewed the patient's chart and labs.  Questions were answered to the patient's satisfaction.     Deatrice FALCON Quincey Nored

## 2024-01-16 NOTE — Op Note (Signed)
 [facility] Patient Name: Mariah Ross Procedure Date: 01/16/2024 7:20 AM MRN: [id number] Date of Birth: Apr 30, 1946 Attending MD: Deatrice Dine , MD, [id number] CSN: [id number] Age: 78 Admit Type: Outpatient Procedure:                Upper GI endoscopy Indications:              Epigastric abdominal pain, Dyspepsia Providers:                Deatrice Dine, MD, Devere Lodge, Dorcas Lenis,                            Technician Referring MD:              Medicines:                Monitored Anesthesia Care Complications:            No immediate complications. Estimated blood loss:                            Minimal. Estimated Blood Loss:     Estimated blood loss: none. Estimated blood loss                            was minimal. Procedure:                Pre-Anesthesia Assessment:                           - Prior to the procedure, a History and Physical                            was performed, and patient medications and                            allergies were reviewed. The patient's tolerance of                            previous anesthesia was also reviewed. The risks                            and benefits of the procedure and the sedation                            options and risks were discussed with the patient.                            All questions were answered, and informed consent                            was obtained. Prior Anticoagulants: The patient has                            taken no anticoagulant or antiplatelet agents                            except for aspirin. ASA Grade  Assessment: II - A                            patient with mild systemic disease. After reviewing                            the risks and benefits, the patient was deemed in                            satisfactory condition to undergo the procedure.                           After obtaining informed consent, the endoscope was                            passed under direct vision.  Throughout the                            procedure, the patient's blood pressure, pulse, and                            oxygen saturations were monitored continuously. The                            [id number]-H190 ([id number]) scope was introduced through the                            mouth, and advanced to the second part of duodenum.                            The upper GI endoscopy was accomplished without                            difficulty. The patient tolerated the procedure                            well. Scope In: 7:49:05 AM Scope Out: 7:54:02 AM Total Procedure Duration: 0 hours 4 minutes 57 seconds  Findings:      LA Grade A (one or more mucosal breaks less than 5 mm, not extending       between tops of 2 mucosal folds) esophagitis with no bleeding was found       at the gastroesophageal junction.      Mild inflammation characterized by erosions was found in the gastric       antrum. Biopsies were taken with a cold forceps for histology.      Two non-bleeding superficial duodenal ulcers with a clean ulcer base       (Forrest Class III) were found in the duodenal bulb. This was biopsied       with a cold forceps for histology. Impression:               - LA Grade A reflux esophagitis with no bleeding.                           - Gastritis.  Biopsied.                           - Non-bleeding duodenal ulcers with a clean ulcer                            base (Forrest Class III). Biopsied. Moderate Sedation:      Per Anesthesia Care Recommendation:           - Patient has a contact number available for                            emergencies. The signs and symptoms of potential                            delayed complications were discussed with the                            patient. Return to normal activities tomorrow.                            Written discharge instructions were provided to the                            patient.                           - Resume previous  diet.                           - Continue present medications.                           - Await pathology results.                           - No aspirin, ibuprofen , naproxen, or other                            non-steroidal anti-inflammatory drugs.                           - Use Protonix  (pantoprazole ) 40 mg PO daily.                           -If symptoms persist obtain CT as ordered by PCP Procedure Code(s):        --- Professional ---                           [phone], Esophagogastroduodenoscopy, flexible,                            transoral; with biopsy, single or multiple Diagnosis Code(s):        --- Professional ---                           K21.00, Gastro-esophageal reflux disease with  esophagitis, without bleeding                           K29.70, Gastritis, unspecified, without bleeding                           K26.9, Duodenal ulcer, unspecified as acute or                            chronic, without hemorrhage or perforation                           R10.13, Epigastric pain CPT copyright 2022 American Medical Association. All rights reserved. The codes documented in this report are preliminary and upon coder review may  be revised to meet current compliance requirements. Deatrice Dine, MD Deatrice Dine, MD 01/16/2024 8:09:10 AM This report has been signed electronically. Number of Addenda: 0

## 2024-01-17 ENCOUNTER — Encounter (HOSPITAL_COMMUNITY): Payer: Self-pay | Admitting: Gastroenterology

## 2024-01-17 LAB — SURGICAL PATHOLOGY

## 2024-01-20 ENCOUNTER — Telehealth (INDEPENDENT_AMBULATORY_CARE_PROVIDER_SITE_OTHER): Payer: Self-pay | Admitting: Gastroenterology

## 2024-01-20 MED ORDER — PANTOPRAZOLE SODIUM 20 MG PO TBEC: 40.0000 mg | DELAYED_RELEASE_TABLET | Freq: Every day | ORAL | 2 refills | Status: DC

## 2024-01-20 NOTE — Telephone Encounter (Signed)
 Pt left voicemail stating she had procedure done on Thursday and provider recommended Pantoprazole  40 mg. Pt states the Pantoprazole  40 mg daily is to strong and wants to know if 20 mg can be called to [facility]. Please advise. Thank you  [phone]

## 2024-01-20 NOTE — Addendum Note (Signed)
 Addended by: CINDERELLA DEATRICE SMILES on: 01/20/2024 04:00 PM   Modules accepted: Orders

## 2024-01-20 NOTE — Telephone Encounter (Signed)
 Pt came to office to inquire about medication. Spoke with pt and made her aware that Pantoprazole  20 mg was sent to [facility]

## 2024-01-21 ENCOUNTER — Ambulatory Visit (INDEPENDENT_AMBULATORY_CARE_PROVIDER_SITE_OTHER): Payer: Self-pay | Admitting: Gastroenterology

## 2024-01-23 NOTE — Progress Notes (Signed)
 Patient result letter mailed procedure note and pathology result faxed to PCP

## 2024-01-30 ENCOUNTER — Telehealth (INDEPENDENT_AMBULATORY_CARE_PROVIDER_SITE_OTHER): Payer: Self-pay | Admitting: Gastroenterology

## 2024-01-30 NOTE — Telephone Encounter (Signed)
 Patient came to front desk asking if someone could go over her biopsy result letter she got from us . I told her both nurses were busy with patients and I could send a phone note and have her call her. [phone]

## 2024-01-30 NOTE — Telephone Encounter (Signed)
 FYI

## 2024-01-30 NOTE — Telephone Encounter (Signed)
 SABRA

## 2024-01-30 NOTE — Telephone Encounter (Signed)
 Pt contacted; I asked pt what questions did she have. Pt stated Ok letter A- what does that mean? Advised pt that means there were changes in her stomach lining due to acid, but nothing to be concerned. Pt also wanted to know about the stomach biopsy-advised pt that was just saying there is no bacteria in stomach. Pt then asked about the CT order by the PCP. Advised pt she would have to call her PCP Dr.Fusco to have them schedule that. Pt was confused about biopsies and CT. Advised pt what CT was and that it was ordered by Dr.Fusco for PCP. Pt states she is not having any abdominal pain now.     DUODENAL BIOPSY:       Duodenal mucosa with foveolar metaplasia and Brunner gland  hyperplasia consistent with chronic peptic duodenitis.       Negative for dysplasia.   B. STOMACH BIOPSY:       Gastric antral / oxyntic mucosa without significant diagnostic  alteration.      No H. pylori identified on HE stain.       Negative for intestinal metaplasia or dysplasia.

## 2024-04-09 ENCOUNTER — Encounter (INDEPENDENT_AMBULATORY_CARE_PROVIDER_SITE_OTHER): Payer: Self-pay | Admitting: Gastroenterology

## 2024-04-09 ENCOUNTER — Ambulatory Visit (INDEPENDENT_AMBULATORY_CARE_PROVIDER_SITE_OTHER): Admitting: Gastroenterology

## 2024-04-09 VITALS — BP 143/89 | HR 71 | Temp 98.2°F | Ht 64.0 in | Wt 181.3 lb

## 2024-04-09 DIAGNOSIS — K279 Peptic ulcer, site unspecified, unspecified as acute or chronic, without hemorrhage or perforation: Secondary | ICD-10-CM

## 2024-04-09 DIAGNOSIS — K219 Gastro-esophageal reflux disease without esophagitis: Secondary | ICD-10-CM

## 2024-04-09 DIAGNOSIS — R1013 Epigastric pain: Secondary | ICD-10-CM

## 2024-04-09 NOTE — Patient Instructions (Signed)
-  continue with Protonix  20mg  daily -Avoid greasy, spicy, fried, citrus foods, and be mindful that caffeine, carbonated drinks, chocolate and alcohol can increase reflux symptoms Stay upright 2-3 hours after eating, prior to lying down and avoid eating late in the evenings. -Please avoid NSAIDs (advil , aleve, naproxen, goody powder, ibuprofen ) as these can be very hard on your GI tract, causing inflammation, ulcers and damage to the lining of your GI tract.   Follow up 1 year  It was a pleasure to see you today. I want to create trusting relationships with patients and provide genuine, compassionate, and quality care. I truly value your feedback! please be on the lookout for a survey regarding your visit with me today. I appreciate your input about our visit and your time in completing this!    Yanet Balliet L. Ura Hausen, MSN, APRN, AGNP-C Adult-Gerontology Nurse Practitioner [facility]

## 2024-04-09 NOTE — Progress Notes (Signed)
 Referring Provider: Bertell Satterfield, MD Primary Care Physician:  Marvine Rush, MD Primary GI Physician: Dr. Cinderella   Chief Complaint  Patient presents with   Follow-up    Pt arrives for follow up. Pt states things are going well. Taking meds and eating smaller meals   HPI:   Mariah Ross is a 78 y.o. female with past medical history of HTN, duodenal ulcer   Patient presenting today for:  Follow up of epigastric pain and dyspepsia  Last seen in July by DR. Ahmed, at that time, having epigastric discomfort on an empty stomach, fullness in abdomen and upper chest.   Recommended EGD Proceed with CT ordered by PCP  CT was not completed  EGD:- 01/2024 [place] Grade A reflux esophagitis with no bleeding.                           - Gastritis. Biopsied.                           - Non-bleeding duodenal ulcers with a clean ulcer                            base (Forrest Class III). Biopsied.  DUODENAL BIOPSY:       Duodenal mucosa with foveolar metaplasia and Brunner gland  hyperplasia consistent with chronic peptic duodenitis.       Negative for dysplasia.   B. STOMACH BIOPSY:       Gastric antral / oxyntic mucosa without significant diagnostic  alteration.      No H. pylori identified on HE stain.       Negative for intestinal metaplasia or dysplasia.   CBC and CMP in July WNL  Present: Feeling much better today. States she is doing well on protonix  20mg . She states that when she was on the 40mg  she had more gas, she states she called to inquire about this and was told to drop down to protonix  20mg  daily.  No further abdominal pain. Weight is stable, appetite is good. Denies any dysphagia. She notes occasional heartburn if she has not eaten for a while but reports this is not on a frequent basis. No rectal bleeding or melena. Denies constipation, diarrhea, nausea or vomiting.    Cologuard early 2022 negative  Last Colonoscopy: 2011 Dr. Harvey, normal, hemorrhoids   Past Medical  History:  Diagnosis Date   Chronic GERD    Hypertension     Past Surgical History:  Procedure Laterality Date   ESOPHAGOGASTRODUODENOSCOPY N/A 01/16/2024   Procedure: EGD (ESOPHAGOGASTRODUODENOSCOPY);  Surgeon: Cinderella Deatrice FALCON, MD;  Location: [facility];  Service: Endoscopy;  Laterality: N/A;  730am, asa 1-2   SKIN BIOPSY     TONSILLECTOMY     TUBAL LIGATION      Current Outpatient Medications  Medication Sig Dispense Refill   amLODipine (NORVASC) 2.5 MG tablet Take 2.5 mg by mouth daily. PRN     Cholecalciferol (VITAMIN D) 50 MCG (2000 UT) CAPS Take 1 capsule by mouth daily.     pantoprazole  (PROTONIX ) 20 MG tablet Take 2 tablets (40 mg total) by mouth daily. 60 tablet 2   rosuvastatin (CRESTOR) 10 MG tablet Take 10 mg by mouth at bedtime.     No current facility-administered medications for this visit.    Allergies as of 04/09/2024   (No Known Allergies)  Social History   Socioeconomic History   Marital status: Married    Spouse name: Not on file   Number of children: Not on file   Years of education: Not on file   Highest education level: Not on file  Occupational History   Not on file  Tobacco Use   Smoking status: Never   Smokeless tobacco: Never  Vaping Use   Vaping status: Never Used  Substance and Sexual Activity   Alcohol use: Yes    Comment: socially - wine   Drug use: No   Sexual activity: Not Currently  Other Topics Concern   Not on file  Social History Narrative   Not on file   Social Drivers of Health   Financial Resource Strain: Not on file  Food Insecurity: Not on file  Transportation Needs: Not on file  Physical Activity: Not on file  Stress: Not on file  Social Connections: Not on file    Review of systems General: negative for malaise, night sweats, fever, chills, weight los Neck: Negative for lumps, goiter, pain and significant neck swelling Resp: Negative for cough, wheezing, dyspnea at rest CV: Negative for chest pain,  leg swelling, palpitations, orthopnea GI: denies melena, hematochezia, nausea, vomiting, diarrhea, constipation, dysphagia, odyonophagia, early satiety or unintentional weight loss. +occasional heartburn MSK: Negative for joint pain or swelling, back pain, and muscle pain. Derm: Negative for itching or rash Psych: Denies depression, anxiety, memory loss, confusion. No homicidal or suicidal ideation.  Heme: Negative for prolonged bleeding, bruising easily, and swollen nodes. Endocrine: Negative for cold or heat intolerance, polyuria, polydipsia and goiter. Neuro: negative for tremor, gait imbalance, syncope and seizures. The remainder of the review of systems is noncontributory.  Physical Exam: BP (!) 148/90   Pulse 71   Temp 98.2 F (36.8 C)   Ht 5' 4 (1.626 m)   Wt 181 lb 4.8 oz (82.2 kg)   BMI 31.12 kg/m  General:   Alert and oriented. No distress noted. Pleasant and cooperative.  Head:  Normocephalic and atraumatic. Eyes:  Conjuctiva clear without scleral icterus. Mouth:  Oral mucosa pink and moist. Good dentition. No lesions. Heart: Normal rate and rhythm, s1 and s2 heart sounds present.  Lungs: Clear lung sounds in all lobes. Respirations equal and unlabored. Abdomen:  +BS, soft, non-tender and non-distended. No rebound or guarding. No HSM or masses noted. Derm: No palmar erythema or jaundice Msk:  Symmetrical without gross deformities. Normal posture. Extremities:  Without edema. Neurologic:  Alert and  oriented x4 Psych:  Alert and cooperative. Normal mood and affect.  Invalid input(s): 6 MONTHS   ASSESSMENT: Mariah Ross is a 78 y.o. female presenting today for follow up of epigastric pain and dyspepsia  Recent EGD in August with grade A esophagitis, gastritis and duodenal ulcers. Patient currently on protonix  20mg  daily as she felt 40mg  daily gave her more feelings of gas. She has no further abdominal discomfort, no dysphagia. She is avoiding NSAIDs, denies rectal  bleeding or melena. She has occasional heartburn, we discussed for now continuing protonix  20mg  daily, strict reflux precautions, may need to consider increasing protonix  back to 40mg  or addition or famotidine  if she begins to have more frequent heartburn.    PLAN:  -continue with Protonix  20mg  daily -strict reflux precautions to include (being mindful of greasy, spicy, fried, tomato/citrus based foods, caffeine, chocolate and alcohol, avoiding eating late and staying upright 2-3 hours after eating, prior to lying down) -consider increasing protonix  to 40mg  daily  or addition of famotidine  if heartburn becomes more frequent -avoid NSAIDs  All questions were answered, patient verbalized understanding and is in agreement with plan as outlined above.   Follow Up: 1 year   Mauro Arps L. Amauria Younts, MSN, APRN, AGNP-C Adult-Gerontology Nurse Practitioner [facility]

## 2024-05-24 ENCOUNTER — Other Ambulatory Visit (INDEPENDENT_AMBULATORY_CARE_PROVIDER_SITE_OTHER): Payer: Self-pay | Admitting: Gastroenterology
